# Patient Record
Sex: Female | Born: 1935 | Race: Black or African American | Hispanic: No | Marital: Single | Smoking: Never smoker
Health system: Southern US, Community
[De-identification: ages and names within clinical notes are randomized; demographics above are authoritative.]

## PROBLEM LIST (undated history)

## (undated) DIAGNOSIS — E1129 Type 2 diabetes mellitus with other diabetic kidney complication: Secondary | ICD-10-CM

## (undated) DIAGNOSIS — K219 Gastro-esophageal reflux disease without esophagitis: Secondary | ICD-10-CM

## (undated) DIAGNOSIS — C419 Malignant neoplasm of bone and articular cartilage, unspecified: Secondary | ICD-10-CM

## (undated) DIAGNOSIS — F329 Major depressive disorder, single episode, unspecified: Secondary | ICD-10-CM

## (undated) DIAGNOSIS — M109 Gout, unspecified: Secondary | ICD-10-CM

## (undated) DIAGNOSIS — J449 Chronic obstructive pulmonary disease, unspecified: Secondary | ICD-10-CM

## (undated) DIAGNOSIS — E785 Hyperlipidemia, unspecified: Secondary | ICD-10-CM

## (undated) DIAGNOSIS — I10 Essential (primary) hypertension: Secondary | ICD-10-CM

## (undated) DIAGNOSIS — F32A Depression, unspecified: Secondary | ICD-10-CM

## (undated) HISTORY — PX: ABOVE KNEE LEG AMPUTATION: SUR20

---

## 2017-07-02 ENCOUNTER — Encounter (HOSPITAL_COMMUNITY): Payer: Self-pay | Admitting: Internal Medicine

## 2017-07-02 ENCOUNTER — Inpatient Hospital Stay (HOSPITAL_COMMUNITY)
Admission: EM | Admit: 2017-07-02 | Discharge: 2017-07-05 | DRG: 378 | Disposition: A | Discharge status: Skilled Nursing Facility | Payer: Medicare Other | Attending: Internal Medicine | Admitting: Internal Medicine

## 2017-07-02 DIAGNOSIS — E1122 Type 2 diabetes mellitus with diabetic chronic kidney disease: Secondary | ICD-10-CM | POA: Diagnosis present

## 2017-07-02 DIAGNOSIS — D649 Anemia, unspecified: Secondary | ICD-10-CM | POA: Diagnosis present

## 2017-07-02 DIAGNOSIS — J9610 Chronic respiratory failure, unspecified whether with hypoxia or hypercapnia: Secondary | ICD-10-CM | POA: Diagnosis present

## 2017-07-02 DIAGNOSIS — Z9981 Dependence on supplemental oxygen: Secondary | ICD-10-CM

## 2017-07-02 DIAGNOSIS — E785 Hyperlipidemia, unspecified: Secondary | ICD-10-CM | POA: Diagnosis present

## 2017-07-02 DIAGNOSIS — K922 Gastrointestinal hemorrhage, unspecified: Principal | ICD-10-CM | POA: Diagnosis present

## 2017-07-02 DIAGNOSIS — M1A9XX1 Chronic gout, unspecified, with tophus (tophi): Secondary | ICD-10-CM | POA: Diagnosis not present

## 2017-07-02 DIAGNOSIS — Z85830 Personal history of malignant neoplasm of bone: Secondary | ICD-10-CM

## 2017-07-02 DIAGNOSIS — K219 Gastro-esophageal reflux disease without esophagitis: Secondary | ICD-10-CM | POA: Diagnosis present

## 2017-07-02 DIAGNOSIS — E611 Iron deficiency: Secondary | ICD-10-CM | POA: Diagnosis present

## 2017-07-02 DIAGNOSIS — M109 Gout, unspecified: Secondary | ICD-10-CM | POA: Diagnosis present

## 2017-07-02 DIAGNOSIS — I5032 Chronic diastolic (congestive) heart failure: Secondary | ICD-10-CM | POA: Diagnosis present

## 2017-07-02 DIAGNOSIS — N184 Chronic kidney disease, stage 4 (severe): Secondary | ICD-10-CM

## 2017-07-02 DIAGNOSIS — Z79899 Other long term (current) drug therapy: Secondary | ICD-10-CM

## 2017-07-02 DIAGNOSIS — I10 Essential (primary) hypertension: Secondary | ICD-10-CM | POA: Diagnosis not present

## 2017-07-02 DIAGNOSIS — F329 Major depressive disorder, single episode, unspecified: Secondary | ICD-10-CM | POA: Diagnosis present

## 2017-07-02 DIAGNOSIS — J449 Chronic obstructive pulmonary disease, unspecified: Secondary | ICD-10-CM | POA: Diagnosis present

## 2017-07-02 DIAGNOSIS — Z794 Long term (current) use of insulin: Secondary | ICD-10-CM

## 2017-07-02 DIAGNOSIS — D638 Anemia in other chronic diseases classified elsewhere: Secondary | ICD-10-CM

## 2017-07-02 DIAGNOSIS — N183 Chronic kidney disease, stage 3 (moderate): Secondary | ICD-10-CM | POA: Diagnosis not present

## 2017-07-02 DIAGNOSIS — F32A Depression, unspecified: Secondary | ICD-10-CM | POA: Diagnosis present

## 2017-07-02 DIAGNOSIS — R195 Other fecal abnormalities: Secondary | ICD-10-CM | POA: Diagnosis present

## 2017-07-02 DIAGNOSIS — Z89611 Acquired absence of right leg above knee: Secondary | ICD-10-CM | POA: Diagnosis not present

## 2017-07-02 DIAGNOSIS — Z7951 Long term (current) use of inhaled steroids: Secondary | ICD-10-CM | POA: Diagnosis not present

## 2017-07-02 DIAGNOSIS — I130 Hypertensive heart and chronic kidney disease with heart failure and stage 1 through stage 4 chronic kidney disease, or unspecified chronic kidney disease: Secondary | ICD-10-CM | POA: Diagnosis present

## 2017-07-02 DIAGNOSIS — D631 Anemia in chronic kidney disease: Secondary | ICD-10-CM | POA: Diagnosis present

## 2017-07-02 DIAGNOSIS — E1129 Type 2 diabetes mellitus with other diabetic kidney complication: Secondary | ICD-10-CM | POA: Diagnosis present

## 2017-07-02 HISTORY — DX: Depression, unspecified: F32.A

## 2017-07-02 HISTORY — DX: Type 2 diabetes mellitus with other diabetic kidney complication: E11.29

## 2017-07-02 HISTORY — DX: Gastro-esophageal reflux disease without esophagitis: K21.9

## 2017-07-02 HISTORY — DX: Hyperlipidemia, unspecified: E78.5

## 2017-07-02 HISTORY — DX: Gout, unspecified: M10.9

## 2017-07-02 HISTORY — DX: Malignant neoplasm of bone and articular cartilage, unspecified: C41.9

## 2017-07-02 HISTORY — DX: Essential (primary) hypertension: I10

## 2017-07-02 HISTORY — DX: Major depressive disorder, single episode, unspecified: F32.9

## 2017-07-02 LAB — CBC WITH DIFFERENTIAL/PLATELET
Basophils Absolute: 0 10*3/uL (ref 0.0–0.1)
Basophils Relative: 0 %
EOS PCT: 2 %
Eosinophils Absolute: 0.2 10*3/uL (ref 0.0–0.7)
HCT: 17 % — ABNORMAL LOW (ref 36.0–46.0)
Hemoglobin: 5.2 g/dL — CL (ref 12.0–15.0)
LYMPHS ABS: 1.5 10*3/uL (ref 0.7–4.0)
Lymphocytes Relative: 15 %
MCH: 24.8 pg — AB (ref 26.0–34.0)
MCHC: 30.6 g/dL (ref 30.0–36.0)
MCV: 81 fL (ref 78.0–100.0)
MONOS PCT: 6 %
Monocytes Absolute: 0.6 10*3/uL (ref 0.1–1.0)
NEUTROS ABS: 7.7 10*3/uL (ref 1.7–7.7)
Neutrophils Relative %: 77 %
PLATELETS: 432 10*3/uL — AB (ref 150–400)
RBC: 2.1 MIL/uL — AB (ref 3.87–5.11)
RDW: 19.2 % — ABNORMAL HIGH (ref 11.5–15.5)
WBC: 10 10*3/uL (ref 4.0–10.5)

## 2017-07-02 LAB — COMPREHENSIVE METABOLIC PANEL
ALT: 10 U/L — AB (ref 14–54)
AST: 13 U/L — ABNORMAL LOW (ref 15–41)
Albumin: 3.1 g/dL — ABNORMAL LOW (ref 3.5–5.0)
Alkaline Phosphatase: 95 U/L (ref 38–126)
Anion gap: 11 (ref 5–15)
BUN: 40 mg/dL — AB (ref 6–20)
CALCIUM: 8.4 mg/dL — AB (ref 8.9–10.3)
CHLORIDE: 105 mmol/L (ref 101–111)
CO2: 23 mmol/L (ref 22–32)
CREATININE: 1.61 mg/dL — AB (ref 0.44–1.00)
GFR, EST AFRICAN AMERICAN: 34 mL/min — AB (ref >60)
GFR, EST NON AFRICAN AMERICAN: 29 mL/min — AB (ref >60)
GLUCOSE: 114 mg/dL — AB (ref 65–99)
Potassium: 4.1 mmol/L (ref 3.5–5.1)
Sodium: 139 mmol/L (ref 135–145)
Total Bilirubin: 0.2 mg/dL — ABNORMAL LOW (ref 0.3–1.2)
Total Protein: 6.5 g/dL (ref 6.5–8.1)

## 2017-07-02 LAB — IRON AND TIBC
Iron: 53 ug/dL (ref 28–170)
Saturation Ratios: 18 % (ref 10.4–31.8)
TIBC: 295 ug/dL (ref 250–450)
UIBC: 242 ug/dL

## 2017-07-02 LAB — RETICULOCYTES
RBC.: 2.11 MIL/uL — AB (ref 3.87–5.11)
RETIC COUNT ABSOLUTE: 164.6 10*3/uL (ref 19.0–186.0)
RETIC CT PCT: 7.8 % — AB (ref 0.4–3.1)

## 2017-07-02 LAB — PROTIME-INR
INR: 0.98
PROTHROMBIN TIME: 12.9 s (ref 11.4–15.2)

## 2017-07-02 LAB — FOLATE: FOLATE: 87 ng/mL (ref >5.9)

## 2017-07-02 LAB — FERRITIN: Ferritin: 14 ng/mL (ref 11–307)

## 2017-07-02 LAB — POC OCCULT BLOOD, ED: Fecal Occult Bld: POSITIVE — AB

## 2017-07-02 LAB — VITAMIN B12: VITAMIN B 12: 1902 pg/mL — AB (ref 180–914)

## 2017-07-02 LAB — ABO/RH: ABO/RH(D): O POS

## 2017-07-02 MED ORDER — HYDRALAZINE HCL 25 MG PO TABS
25.0000 mg | ORAL_TABLET | Freq: Four times a day (QID) | ORAL | Status: DC
  Administered 2017-07-02 – 2017-07-05 (×12): 25 mg via ORAL
  Filled 2017-07-02 (×12): qty 1

## 2017-07-02 MED ORDER — DULOXETINE HCL 30 MG PO CPEP
30.0000 mg | ORAL_CAPSULE | Freq: Every day | ORAL | Status: DC
  Administered 2017-07-03 – 2017-07-05 (×3): 30 mg via ORAL
  Filled 2017-07-02 (×3): qty 1

## 2017-07-02 MED ORDER — POLYVINYL ALCOHOL 1.4 % OP SOLN
1.0000 [drp] | Freq: Two times a day (BID) | OPHTHALMIC | Status: DC
  Administered 2017-07-03 – 2017-07-05 (×6): 1 [drp] via OPHTHALMIC
  Filled 2017-07-02: qty 15

## 2017-07-02 MED ORDER — HYDRALAZINE HCL 20 MG/ML IJ SOLN: 5.0000 mg | INTRAMUSCULAR | Status: DC | PRN

## 2017-07-02 MED ORDER — TETRAHYDROZOLINE HCL 0.05 % OP SOLN
1.0000 [drp] | Freq: Two times a day (BID) | OPHTHALMIC | Status: DC
  Administered 2017-07-03 – 2017-07-05 (×6): 1 [drp] via OPHTHALMIC
  Filled 2017-07-02: qty 15

## 2017-07-02 MED ORDER — ONDANSETRON HCL 4 MG/2ML IJ SOLN: 4.0000 mg | Freq: Four times a day (QID) | INTRAMUSCULAR | Status: DC | PRN

## 2017-07-02 MED ORDER — SODIUM CHLORIDE 0.9 % IV SOLN
8.0000 mg/h | INTRAVENOUS | Status: DC
  Administered 2017-07-03: 8 mg/h via INTRAVENOUS
  Filled 2017-07-02 (×2): qty 80

## 2017-07-02 MED ORDER — SODIUM CHLORIDE 0.9 % IV SOLN
80.0000 mg | Freq: Once | INTRAVENOUS | Status: AC
  Administered 2017-07-03: 02:00:00 80 mg via INTRAVENOUS
  Filled 2017-07-02: qty 80

## 2017-07-02 MED ORDER — ACETAMINOPHEN 650 MG RE SUPP: 650.0000 mg | Freq: Four times a day (QID) | RECTAL | Status: DC | PRN

## 2017-07-02 MED ORDER — PANTOPRAZOLE SODIUM 40 MG IV SOLR: 40.0000 mg | Freq: Two times a day (BID) | INTRAVENOUS | Status: DC

## 2017-07-02 MED ORDER — FERROUS SULFATE 325 (65 FE) MG PO TABS
325.0000 mg | ORAL_TABLET | Freq: Two times a day (BID) | ORAL | Status: DC
  Administered 2017-07-02 – 2017-07-05 (×6): 325 mg via ORAL
  Filled 2017-07-02 (×6): qty 1

## 2017-07-02 MED ORDER — ATORVASTATIN CALCIUM 20 MG PO TABS
20.0000 mg | ORAL_TABLET | Freq: Every day | ORAL | Status: DC
  Administered 2017-07-03 – 2017-07-05 (×3): 20 mg via ORAL
  Filled 2017-07-02 (×3): qty 1

## 2017-07-02 MED ORDER — ADULT MULTIVITAMIN W/MINERALS CH
1.0000 | ORAL_TABLET | Freq: Every day | ORAL | Status: DC
  Administered 2017-07-03 – 2017-07-05 (×3): 1 via ORAL
  Filled 2017-07-02 (×3): qty 1

## 2017-07-02 MED ORDER — ZOLPIDEM TARTRATE 5 MG PO TABS: 5.0000 mg | ORAL_TABLET | Freq: Every evening | ORAL | Status: DC | PRN

## 2017-07-02 MED ORDER — SODIUM CHLORIDE 0.9 % IV SOLN
Freq: Once | INTRAVENOUS | Status: AC
  Administered 2017-07-02: 19:00:00 via INTRAVENOUS

## 2017-07-02 MED ORDER — AMLODIPINE BESYLATE 5 MG PO TABS
5.0000 mg | ORAL_TABLET | Freq: Every day | ORAL | Status: DC
  Administered 2017-07-03 – 2017-07-05 (×3): 5 mg via ORAL
  Filled 2017-07-02 (×3): qty 1

## 2017-07-02 MED ORDER — INSULIN ASPART 100 UNIT/ML ~~LOC~~ SOLN: 0.0000 [IU] | Freq: Three times a day (TID) | SUBCUTANEOUS | Status: DC

## 2017-07-02 MED ORDER — MOMETASONE FURO-FORMOTEROL FUM 200-5 MCG/ACT IN AERO
2.0000 | INHALATION_SPRAY | Freq: Two times a day (BID) | RESPIRATORY_TRACT | Status: DC
  Administered 2017-07-03 – 2017-07-05 (×5): 2 via RESPIRATORY_TRACT
  Filled 2017-07-02: qty 8.8

## 2017-07-02 MED ORDER — INSULIN ASPART 100 UNIT/ML ~~LOC~~ SOLN: 0.0000 [IU] | Freq: Every day | SUBCUTANEOUS | Status: DC

## 2017-07-02 MED ORDER — CENTRUM PO CHEW: 1.0000 | CHEWABLE_TABLET | Freq: Every day | ORAL | Status: DC

## 2017-07-02 MED ORDER — ALBUTEROL SULFATE (2.5 MG/3ML) 0.083% IN NEBU: 2.5000 mg | INHALATION_SOLUTION | RESPIRATORY_TRACT | Status: DC | PRN

## 2017-07-02 MED ORDER — VITAMIN C 500 MG PO TABS
500.0000 mg | ORAL_TABLET | Freq: Every day | ORAL | Status: DC
  Administered 2017-07-03 – 2017-07-05 (×3): 500 mg via ORAL
  Filled 2017-07-02 (×3): qty 1

## 2017-07-02 MED ORDER — PROPYLENE GLYCOL 0.6 % OP SOLN: Freq: Two times a day (BID) | OPHTHALMIC | Status: DC

## 2017-07-02 MED ORDER — FOLIC ACID 1 MG PO TABS
1.0000 mg | ORAL_TABLET | Freq: Every day | ORAL | Status: DC
  Administered 2017-07-03 – 2017-07-05 (×3): 1 mg via ORAL
  Filled 2017-07-02 (×3): qty 1

## 2017-07-02 MED ORDER — ACETAMINOPHEN 325 MG PO TABS: 650.0000 mg | ORAL_TABLET | Freq: Four times a day (QID) | ORAL | Status: DC | PRN

## 2017-07-02 MED ORDER — ALLOPURINOL 100 MG PO TABS
100.0000 mg | ORAL_TABLET | Freq: Two times a day (BID) | ORAL | Status: DC
  Administered 2017-07-02 – 2017-07-05 (×6): 100 mg via ORAL
  Filled 2017-07-02 (×6): qty 1

## 2017-07-02 MED ORDER — ONDANSETRON HCL 4 MG PO TABS: 4.0000 mg | ORAL_TABLET | Freq: Four times a day (QID) | ORAL | Status: DC | PRN

## 2017-07-02 MED ORDER — TRAMADOL HCL 50 MG PO TABS: 50.0000 mg | ORAL_TABLET | Freq: Three times a day (TID) | ORAL | Status: DC | PRN

## 2017-07-02 NOTE — ED Triage Notes (Addendum)
Pt arrived to [facility] via [place] from [facility]. Pt has low hemoglobin. Last lab was 4.1. Pt sent to [facility] for further evaluation.

## 2017-07-02 NOTE — ED Provider Notes (Signed)
Patient accepted signout from Dr. Billy Fischer. Recheck on the patient. She is getting blood transfuse. She is alert and nontoxic. No distress. No complaints. Rectal exam performed.Stool is dark blackish green. Patient reports however her stool chronically looks like that with her iron supplementation. We'll proceed with her admission.   Charlesetta Shanks, MD 07/02/17 [phone]
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
## 2017-07-02 NOTE — ED Notes (Signed)
Pt moved from [place] D to R17

## 2017-07-02 NOTE — H&P (Signed)
History and Physical    Eileen Cobb CWC:[id number] DOB: [birth year] DOA: 07/02/2017  Referring MD/NP/PA:   PCP: Eileen Iba, MD   Patient coming from:  The patient is coming from SNF  At baseline, pt is dependent for most of ADL.  Chief Complaint: low hemoglobin  HPI: Eileen Cobb is a 82 y.o. female with medical history significant of bone cancer (s/p of R AKA per pt's report), hypertension, hyperlipidemia, diabetes mellitus, GERD, gout, depression, anemia, who presents with low hemoglobin.   Pt is a poor historian, the history is limited. Pt is currently at [facility]. She has been there since [facility]. She was in [place] at [place]. She was found to have low hemoglobin of 4.1 and sent to [facility] for further evaluation. She denies chest pain, shortness breath, dizziness, lightheadedness. No nausea, vomiting, diarrhea, abdominal pain. She is taking iron pill, so stools are dark normally without change recently. No hematemesis, hematuria, hemoptysis. No falls or trauma. Reports cancer doctor in [place]. Patient dose not have cough, fever, chills, symptoms of UTI or unilateral weakness. Pt seems to have chronic an  ED Course: pt was found to have hemoglobin 5.2 (no baseline hemoglobin available), positive FOBT, INR 0.98, WBC 10.0, creatinine 1.61 (no baseline creatinine available), temperature normal, no tachycardia, oxygen saturation 94% on room air. Patient is admitted to telemetry bed as inpatient. Dr. Janese Banks of GI was consulted.  Review of Systems:   General: no fevers, chills, no body weight gain. Has fatigue. HEENT: no blurry vision, hearing changes or sore throat Respiratory: no dyspnea, coughing, wheezing CV: no chest pain, no palpitations GI: no nausea, vomiting, abdominal pain, diarrhea, constipation. Has dark stool. GU: no dysuria, burning on urination, increased urinary frequency, hematuria  Ext: no leg edema. S/p of R AKA Neuro:  no unilateral weakness, numbness, or tingling, no vision change or hearing loss Skin: no rash, no skin tear. MSK: No muscle spasm, no deformity, no limitation of range of movement in spin Heme: No easy bruising.  Travel history: No recent long distant travel.  Allergy: No Known Allergies  Past Medical History:  Diagnosis Date  . Bone cancer ([place])   . Depression   . Essential hypertension   . GERD (gastroesophageal reflux disease)   . Gout   . HLD (hyperlipidemia)   . Type II diabetes mellitus with renal manifestations [facility])     Past Surgical History:  Procedure Laterality Date  . ABOVE KNEE LEG AMPUTATION Right     Social History:  reports that she has never smoked. She does not have any smokeless tobacco history on file. She reports that she does not drink alcohol or use drugs.  Family History: reviewed with patient, but patient does not know any family medical history.  Prior to Admission medications   Medication Sig Start Date End Date Taking? Authorizing Provider  acetaminophen (TYLENOL) 325 MG tablet Take 650 mg by mouth 2 (two) times daily as needed for mild pain.   Yes [provider]  allopurinol (ZYLOPRIM) 100 MG tablet Take 100 mg by mouth 2 (two) times daily. 06/24/17  Yes [provider]  amLODipine (NORVASC) 5 MG tablet Take 5 mg by mouth daily. 06/09/17  Yes [provider]  atorvastatin (LIPITOR) 20 MG tablet Take 20 mg by mouth daily. 06/18/17  Yes [provider]  DULoxetine (CYMBALTA) 30 MG capsule Take 30 mg by mouth daily. 06/09/17  Yes [provider]  EPOGEN 10000 UNIT/ML injection 20,000 Units  by Subcutaneous Infusion route. Every other week 06/11/17  Yes [provider]  ferrous sulfate 325 (65 FE) MG tablet Take 325 mg by mouth 2 (two) times daily. FOR ANEMIA   Yes [provider]  folic acid (FOLVITE) 1 MG tablet Take 1 mg by mouth daily.   Yes [provider]  furosemide (LASIX) 40 MG  tablet Take 40 mg by mouth daily. 06/18/17  Yes [provider]  hydrALAZINE (APRESOLINE) 25 MG tablet Take 25 mg by mouth every 6 (six) hours. 04/28/17  Yes [provider]  insulin NPH Human (HUMULIN N,NOVOLIN N) 100 UNIT/ML injection Inject 2-8 Units into the skin. SLIDING SCALE BEFORE MEALS-CBG 201-250=2 UNITS, 251-300=4 UNITS, 301-350=6 UNITS, 351-400=8 UNITS, OVER 400 CALL MD, LESS THAN 60=ORANGE JUICE AND RECHECK IN 30 MINUTES   Yes [provider]  multivitamin-iron-minerals-folic acid (CENTRUM) chewable tablet Chew 1 tablet by mouth daily.   Yes [provider]  omeprazole (PRILOSEC) 20 MG capsule Take 20 mg by mouth 2 (two) times daily.   Yes [provider]  Propylene Glycol (SYSTANE BALANCE OP) Place 1 drop into both eyes 2 (two) times daily. FOR DRY EYE SYNDROME   Yes [provider]  SYMBICORT 160-4.5 MCG/ACT inhaler Take 2 puffs by mouth 2 (two) times daily. 06/06/17  Yes [provider]  Tetrahydrozoline HCl (VISINE OP) Place 1 drop into both eyes 2 (two) times daily. FOR DRY EYE SYNDROME   Yes [provider]  traMADol (ULTRAM) 50 MG tablet Take 50 mg by mouth every 8 (eight) hours as needed. 03/31/17  Yes [provider]  vitamin C (ASCORBIC ACID) 500 MG tablet Take 500 mg by mouth daily.   Yes [provider]    Physical Exam: Vitals:   07/02/17 1520 07/02/17 1845 07/02/17 1904 07/02/17 1915  BP: (!) 162/55 (!) 145/65 (!) 145/65 (!) 148/59  Pulse: 87 88 88 86  Resp: 18 18 18 20   Temp: 98.6 F (37 C) 98.2 F (36.8 C) 98.2 F (36.8 C) 98.5 F (36.9 C)  TempSrc: Oral Oral Oral Oral  SpO2: 100% 100%     General: Not in acute distress. Pale looking. HEENT:       Eyes: PERRL, EOMI, no scleral icterus.       ENT: No discharge from the ears and nose, no pharynx injection, no tonsillar enlargement.        Neck: No JVD, no bruit, no mass felt. Heme: No neck lymph node enlargement. Cardiac:  S1/S2, RRR, No murmurs, No gallops or rubs. Respiratory: No rales, wheezing, rhonchi or rubs. GI: Soft, nondistended, nontender, no rebound pain, no organomegaly, BS present. GU: No hematuria Ext: No pitting leg edema bilaterally. 2+DP/PT pulse in left leg. S/p of R AKA.  Musculoskeletal: No joint deformities, No joint redness or warmth, no limitation of ROM in spin. Skin: No rashes.  Neuro: Alert, oriented X3, cranial nerves II-XII grossly intact, moves all extremities normally.  Psych: Patient is not psychotic, no suicidal or hemocidal ideation.  Labs on Admission: I have personally reviewed following labs and imaging studies  CBC:  Recent Labs Lab 07/02/17 1645  WBC 10.0  NEUTROABS 7.7  HGB 5.2*  HCT 17.0*  MCV 81.0  PLT 657*   Basic Metabolic Panel:  Recent Labs Lab 07/02/17 1645  NA 139  K 4.1  CL 105  CO2 23  GLUCOSE 114*  BUN 40*  CREATININE 1.61*  CALCIUM 8.4*   GFR: CrCl cannot be calculated (Unknown  ideal weight.). Liver Function Tests:  Recent Labs Lab 07/02/17 1645  AST 13*  ALT 10*  ALKPHOS 95  BILITOT 0.2*  PROT 6.5  ALBUMIN 3.1*   No results for input(s): LIPASE, AMYLASE in the last 168 hours. No results for input(s): AMMONIA in the last 168 hours. Coagulation Profile:  Recent Labs Lab 07/02/17 1645  INR 0.98   Cardiac Enzymes: No results for input(s): CKTOTAL, CKMB, CKMBINDEX, TROPONINI in the last 168 hours. BNP (last 3 results) No results for input(s): PROBNP in the last 8760 hours. HbA1C: No results for input(s): HGBA1C in the last 72 hours. CBG: No results for input(s): GLUCAP in the last 168 hours. Lipid Profile: No results for input(s): CHOL, HDL, LDLCALC, TRIG, CHOLHDL, LDLDIRECT in the last 72 hours. Thyroid Function Tests: No results for input(s): TSH, T4TOTAL, FREET4, T3FREE, THYROIDAB in the last 72 hours. Anemia Panel:  Recent Labs  07/02/17 1645  RETICCTPCT 7.8*   Urine analysis: No results found for:  COLORURINE, APPEARANCEUR, LABSPEC, PHURINE, GLUCOSEU, HGBUR, BILIRUBINUR, KETONESUR, PROTEINUR, UROBILINOGEN, NITRITE, LEUKOCYTESUR Sepsis Labs: @LABRCNTIP (procalcitonin:4,lacticidven:4) )No results found for this or any previous visit (from the past 240 hour(s)).   Radiological Exams on Admission: No results found.   EKG: Not done in ED, will get one.   Assessment/Plan Principal Problem:   GIB (gastrointestinal bleeding) Active Problems:   Normocytic anemia   Essential hypertension   HLD (hyperlipidemia)   GERD (gastroesophageal reflux disease)   Type II diabetes mellitus with renal manifestations (H[facility])   Gout   Depression   GIB (gastrointestinal bleeding): pt has positive FOBT, with Hgb 5.2. Currently hemodynamically stable. No baseline hemoglobin available.Patient seems to have chronic anemia, since patient is on iron supplement and Epogen injection. Not sure if pt has EGD or colonoscopy in the past. We will request the medical record.  - will admit to tele bed as inpt - transfuse 3 units of blood now - GI was consulted, will follow up recommendations - NPO - Start IV pantoprazole gtt - Zofran IV for nausea - Avoid NSAIDs and SQ heparin - Maintain IV access (2 large bore IVs if possible). - Monitor closely and follow q6h cbc, transfuse as necessary. - LaB: INR, PTT and type screen  Normocytic anemia: Patient seems to have chronic anemia, since patient is on iron supplement and Epogen injection. Now has GIB -check anemia panel  HTN: hemoglobin 163/83.  -continue amlodipine, hydralazine -IV hydralazine when necessary -patient is also on Lasix, not sure if patient has history of CHF are not-->Will check BNP  HLD (hyperlipidemia): -Lipitor  GERD: -on Protonix IV  DM-II: Last A1c not on record. Patient is taking NPH at home -SSI -Check A1c  Gout: -continue home allopurinol   Depression: no suicidal or homicidal ideations. -Continue home medications:  Cymbalta   DVT ppx: SCD Code Status: Full code Family Communication: None at bed side.   Disposition Plan:  Anticipate discharge back to previous home environment Consults called: Dr. Janese Banks of GI was consulted. Admission status:  Inpatient/tele     Date of Service 07/02/2017    Ivor Costa [facility] Pager [phone]  If 7PM-7AM, please contact night-coverage [email or web address] Password [email or web address] 07/02/2017, 8:20 PM

## 2017-07-02 NOTE — Progress Notes (Signed)
The med list sent from [facility] did not include administration records/last doses. Multiple attempts have been made to call the facility at the phone number listed on the internet([phone]) but no one answers.   Romeo Rabon, PharmD. Mobile: [phone]. 07/02/2017,5:22 PM.

## 2017-07-02 NOTE — ED Notes (Signed)
Please call report to Mechele Claude, RN at 2025 at [phone]. Thank you!

## 2017-07-03 ENCOUNTER — Encounter (HOSPITAL_COMMUNITY): Payer: Self-pay | Admitting: *Deleted

## 2017-07-03 LAB — PREPARE RBC (CROSSMATCH)

## 2017-07-03 LAB — HEMOGLOBIN AND HEMATOCRIT, BLOOD
HEMATOCRIT: 31.9 % — AB (ref 36.0–46.0)
Hemoglobin: 10.4 g/dL — ABNORMAL LOW (ref 12.0–15.0)

## 2017-07-03 LAB — CBC
HCT: 29.6 % — ABNORMAL LOW (ref 36.0–46.0)
Hemoglobin: 10 g/dL — ABNORMAL LOW (ref 12.0–15.0)
MCH: 27.8 pg (ref 26.0–34.0)
MCHC: 33.8 g/dL (ref 30.0–36.0)
MCV: 82.2 fL (ref 78.0–100.0)
Platelets: 374 10*3/uL (ref 150–400)
RBC: 3.6 MIL/uL — ABNORMAL LOW (ref 3.87–5.11)
RDW: 15.5 % (ref 11.5–15.5)
WBC: 10.9 10*3/uL — AB (ref 4.0–10.5)

## 2017-07-03 LAB — BASIC METABOLIC PANEL
Anion gap: 8 (ref 5–15)
BUN: 34 mg/dL — ABNORMAL HIGH (ref 6–20)
CHLORIDE: 111 mmol/L (ref 101–111)
CO2: 24 mmol/L (ref 22–32)
Calcium: 8.7 mg/dL — ABNORMAL LOW (ref 8.9–10.3)
Creatinine, Ser: 1.47 mg/dL — ABNORMAL HIGH (ref 0.44–1.00)
GFR calc non Af Amer: 32 mL/min — ABNORMAL LOW (ref >60)
GFR, EST AFRICAN AMERICAN: 38 mL/min — AB (ref >60)
Glucose, Bld: 88 mg/dL (ref 65–99)
POTASSIUM: 4 mmol/L (ref 3.5–5.1)
SODIUM: 143 mmol/L (ref 135–145)

## 2017-07-03 LAB — GLUCOSE, CAPILLARY
GLUCOSE-CAPILLARY: 101 mg/dL — AB (ref 65–99)
GLUCOSE-CAPILLARY: 67 mg/dL (ref 65–99)
GLUCOSE-CAPILLARY: 70 mg/dL (ref 65–99)
Glucose-Capillary: 108 mg/dL — ABNORMAL HIGH (ref 65–99)
Glucose-Capillary: 105 mg/dL — ABNORMAL HIGH (ref 65–99)

## 2017-07-03 LAB — BRAIN NATRIURETIC PEPTIDE: B NATRIURETIC PEPTIDE 5: 153.9 pg/mL — AB (ref 0.0–100.0)

## 2017-07-03 LAB — HEMOGLOBIN A1C
HEMOGLOBIN A1C: 4.8 % (ref 4.8–5.6)
MEAN PLASMA GLUCOSE: 91.06 mg/dL

## 2017-07-03 LAB — MRSA PCR SCREENING: MRSA BY PCR: POSITIVE — AB

## 2017-07-03 LAB — APTT: aPTT: 32 seconds (ref 24–36)

## 2017-07-03 MED ORDER — CHLORHEXIDINE GLUCONATE CLOTH 2 % EX PADS
6.0000 | MEDICATED_PAD | Freq: Every day | CUTANEOUS | Status: DC
  Administered 2017-07-03 – 2017-07-05 (×3): 6 via TOPICAL

## 2017-07-03 MED ORDER — PANTOPRAZOLE SODIUM 40 MG IV SOLR
40.0000 mg | Freq: Two times a day (BID) | INTRAVENOUS | Status: DC
  Administered 2017-07-03 – 2017-07-04 (×3): 40 mg via INTRAVENOUS
  Filled 2017-07-03 (×3): qty 40

## 2017-07-03 MED ORDER — MUPIROCIN 2 % EX OINT
1.0000 "application " | TOPICAL_OINTMENT | Freq: Two times a day (BID) | CUTANEOUS | Status: DC
  Administered 2017-07-03 – 2017-07-05 (×5): 1 via NASAL
  Filled 2017-07-03: qty 22

## 2017-07-03 NOTE — Progress Notes (Signed)
CRITICAL VALUE ALERT  Critical Value:Positive MRSA  Date & Time Notied:  07/03/17;0403  Provider Notified:yes  Orders Received/Actions taken:awaiting any new orders.
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
## 2017-07-03 NOTE — Consult Note (Signed)
Referring Provider: Dr. Waldron Labs Primary Care Physician:  [name], Manya Silvas, MD Primary Gastroenterologist:  Althia Forts  Reason for Consultation:  Anemia  HPI: Eileen Cobb is a 82 y.o. female with multiple medical problems who was transferred from [place] due to [facility]. She was found to have a Hgb 4.1 and is heme positive. Denies visible bleeding but her history is difficult to obtain. She is oriented to person and time. Denies abdominal pain, heartburn, N/V. She has a history of iron deficiency with her being on iron pills. She thinks she had an EGD/colonoscopy several years ago but results are unknown and are not available at this time. Feels ok. Hgb 5.2 on admit yesterday and transfused 2 U PRBCs to 10.  Past Medical History:  Diagnosis Date  . Bone cancer ([place])   . Depression   . Essential hypertension   . GERD (gastroesophageal reflux disease)   . Gout   . HLD (hyperlipidemia)   . Type II diabetes mellitus with renal manifestations [facility])     Past Surgical History:  Procedure Laterality Date  . ABOVE KNEE LEG AMPUTATION Right     Prior to Admission medications   Medication Sig Start Date End Date Taking? Authorizing Provider  acetaminophen (TYLENOL) 325 MG tablet Take 650 mg by mouth 2 (two) times daily as needed for mild pain.   Yes [provider]  allopurinol (ZYLOPRIM) 100 MG tablet Take 100 mg by mouth 2 (two) times daily. 06/24/17  Yes [provider]  amLODipine (NORVASC) 5 MG tablet Take 5 mg by mouth daily. 06/09/17  Yes [provider]  atorvastatin (LIPITOR) 20 MG tablet Take 20 mg by mouth daily. 06/18/17  Yes [provider]  DULoxetine (CYMBALTA) 30 MG capsule Take 30 mg by mouth daily. 06/09/17  Yes [provider]  EPOGEN 10000 UNIT/ML injection 20,000 Units by Subcutaneous Infusion route. Every other week 06/11/17  Yes [provider]  ferrous sulfate 325 (65 FE) MG tablet Take 325 mg by  mouth 2 (two) times daily. FOR ANEMIA   Yes [provider]  folic acid (FOLVITE) 1 MG tablet Take 1 mg by mouth daily.   Yes [provider]  furosemide (LASIX) 40 MG tablet Take 40 mg by mouth daily. 06/18/17  Yes [provider]  hydrALAZINE (APRESOLINE) 25 MG tablet Take 25 mg by mouth every 6 (six) hours. 04/28/17  Yes [provider]  insulin NPH Human (HUMULIN N,NOVOLIN N) 100 UNIT/ML injection Inject 2-8 Units into the skin. SLIDING SCALE BEFORE MEALS-CBG 201-250=2 UNITS, 251-300=4 UNITS, 301-350=6 UNITS, 351-400=8 UNITS, OVER 400 CALL MD, LESS THAN 60=ORANGE JUICE AND RECHECK IN 30 MINUTES   Yes [provider]  multivitamin-iron-minerals-folic acid (CENTRUM) chewable tablet Chew 1 tablet by mouth daily.   Yes [provider]  omeprazole (PRILOSEC) 20 MG capsule Take 20 mg by mouth 2 (two) times daily.   Yes [provider]  Propylene Glycol (SYSTANE BALANCE OP) Place 1 drop into both eyes 2 (two) times daily. FOR DRY EYE SYNDROME   Yes [provider]  SYMBICORT 160-4.5 MCG/ACT inhaler Take 2 puffs by mouth 2 (two) times daily. 06/06/17  Yes [provider]  Tetrahydrozoline HCl (VISINE OP) Place 1 drop into both eyes 2 (two) times daily. FOR DRY EYE SYNDROME   Yes [provider]  traMADol (ULTRAM) 50 MG tablet Take 50 mg by mouth every 8 (eight) hours as needed. 03/31/17  Yes [provider]  vitamin C (ASCORBIC  ACID) 500 MG tablet Take 500 mg by mouth daily.   Yes [provider]    Scheduled Meds: . allopurinol  100 mg Oral BID  . amLODipine  5 mg Oral Daily  . atorvastatin  20 mg Oral Daily  . Chlorhexidine Gluconate Cloth  6 each Topical Q0600  . DULoxetine  30 mg Oral Daily  . ferrous sulfate  325 mg Oral BID  . folic acid  1 mg Oral Daily  . hydrALAZINE  25 mg Oral Q6H  . insulin aspart  0-5 Units Subcutaneous QHS  . insulin aspart  0-9 Units Subcutaneous TID WC  .  mometasone-formoterol  2 puff Inhalation BID  . multivitamin with minerals  1 tablet Oral Daily  . mupirocin ointment  1 application Nasal BID  . [START ON 07/06/2017] pantoprazole  40 mg Intravenous Q12H  . polyvinyl alcohol  1 drop Both Eyes BID  . tetrahydrozoline  1 drop Both Eyes BID  . vitamin C  500 mg Oral Daily   Continuous Infusions: . pantoprozole (PROTONIX) infusion 8 mg/hr (07/03/17 0227)   PRN Meds:.acetaminophen **OR** acetaminophen, albuterol, hydrALAZINE, ondansetron **OR** ondansetron (ZOFRAN) IV, traMADol, zolpidem  Allergies as of 07/02/2017  . (No Known Allergies)    No family history on file.  Social History   Social History  . Marital status: Single    Spouse name: N/A  . Number of children: N/A  . Years of education: N/A   Occupational History  . Not on file.   Social History Main Topics  . Smoking status: Never Smoker  . Smokeless tobacco: Never Used  . Alcohol use No  . Drug use: No  . Sexual activity: Not on file   Other Topics Concern  . Not on file   Social History Narrative  . No narrative on file    Review of Systems: All negative except as stated above in HPI.  Physical Exam: Vital signs: Vitals:   07/03/17 0650 07/03/17 0814  BP: (!) 140/56   Pulse: 84   Resp:    Temp: 97.6 F (36.4 C)   SpO2: 100% 99%     General:   Lethargic, elderly, Well-developed, well-nourished, pleasant and cooperative in NAD Head: normocephalic, atraumatic Eyes: anicteric sclera ENT: oropharynx clear Neck: supple, nontender Lungs:  Clear throughout to auscultation.   No wheezes, crackles, or rhonchi. No acute distress. Heart:  Regular rate and rhythm; no murmurs, clicks, rubs,  or gallops. Abdomen: soft, nontender, nondistended, +BS  Rectal:  Deferred Ext: no LLE edema; s/p right AKA  GI:  Lab Results:  Recent Labs  07/02/17 1645 07/03/17 0828  WBC 10.0 10.9*  HGB 5.2* 10.0*  HCT 17.0* 29.6*  PLT 432* 374   BMET  Recent Labs   07/02/17 1645 07/03/17 0828  NA 139 143  K 4.1 4.0  CL 105 111  CO2 23 24  GLUCOSE 114* 88  BUN 40* 34*  CREATININE 1.61* 1.47*  CALCIUM 8.4* 8.7*   LFT  Recent Labs  07/02/17 1645  PROT 6.5  ALBUMIN 3.1*  AST 13*  ALT 10*  ALKPHOS 95  BILITOT 0.2*   PT/INR  Recent Labs  07/02/17 1645  LABPROT 12.9  INR 0.98     Studies/Results: No results found.  Impression/Plan: Severe anemia with heme positive stool. Has been on iron pills so will have black stools when they occur. Would manage conservatively and NOT pursue any invasive GI procedures unless shows signs of overt bleeding and hemodynamic instability.  Primary team needs to obtain her previous EGD/colonoscopy records from [place] that she reports having. Will d/c Protonix drip and change to IV Q 12 hours. Continue supportive care. Dr. Cristina Gong to f/u tomorrow.    LOS: 1 day   Derby Line C.  07/03/2017, 10:38 AM  Pager [phone]  AFTER 5 pm or on weekends please call [phone]
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
## 2017-07-04 DIAGNOSIS — E785 Hyperlipidemia, unspecified: Secondary | ICD-10-CM

## 2017-07-04 DIAGNOSIS — E1122 Type 2 diabetes mellitus with diabetic chronic kidney disease: Secondary | ICD-10-CM

## 2017-07-04 DIAGNOSIS — K922 Gastrointestinal hemorrhage, unspecified: Principal | ICD-10-CM

## 2017-07-04 DIAGNOSIS — D638 Anemia in other chronic diseases classified elsewhere: Secondary | ICD-10-CM

## 2017-07-04 DIAGNOSIS — M1A9XX1 Chronic gout, unspecified, with tophus (tophi): Secondary | ICD-10-CM

## 2017-07-04 DIAGNOSIS — N183 Chronic kidney disease, stage 3 (moderate): Secondary | ICD-10-CM

## 2017-07-04 DIAGNOSIS — N184 Chronic kidney disease, stage 4 (severe): Secondary | ICD-10-CM

## 2017-07-04 DIAGNOSIS — K219 Gastro-esophageal reflux disease without esophagitis: Secondary | ICD-10-CM

## 2017-07-04 DIAGNOSIS — Z794 Long term (current) use of insulin: Secondary | ICD-10-CM

## 2017-07-04 DIAGNOSIS — I10 Essential (primary) hypertension: Secondary | ICD-10-CM

## 2017-07-04 DIAGNOSIS — F329 Major depressive disorder, single episode, unspecified: Secondary | ICD-10-CM

## 2017-07-04 LAB — BPAM RBC
BLOOD PRODUCT EXPIRATION DATE: 201811142359
BLOOD PRODUCT EXPIRATION DATE: 201811162359
Blood Product Expiration Date: 201811162359
ISSUE DATE / TIME: 201810190020
ISSUE DATE / TIME: 201810181846
ISSUE DATE / TIME: 201810190419
UNIT TYPE AND RH: 5100
Unit Type and Rh: 5100
Unit Type and Rh: 5100

## 2017-07-04 LAB — CBC
HEMATOCRIT: 29.6 % — AB (ref 36.0–46.0)
Hemoglobin: 9.6 g/dL — ABNORMAL LOW (ref 12.0–15.0)
MCH: 26.8 pg (ref 26.0–34.0)
MCHC: 32.4 g/dL (ref 30.0–36.0)
MCV: 82.7 fL (ref 78.0–100.0)
PLATELETS: 432 10*3/uL — AB (ref 150–400)
RBC: 3.58 MIL/uL — ABNORMAL LOW (ref 3.87–5.11)
RDW: 16.2 % — AB (ref 11.5–15.5)
WBC: 10.5 10*3/uL (ref 4.0–10.5)

## 2017-07-04 LAB — GLUCOSE, CAPILLARY
GLUCOSE-CAPILLARY: 85 mg/dL (ref 65–99)
Glucose-Capillary: 92 mg/dL (ref 65–99)
Glucose-Capillary: 104 mg/dL — ABNORMAL HIGH (ref 65–99)
Glucose-Capillary: 116 mg/dL — ABNORMAL HIGH (ref 65–99)

## 2017-07-04 LAB — BASIC METABOLIC PANEL
Anion gap: 8 (ref 5–15)
BUN: 29 mg/dL — ABNORMAL HIGH (ref 6–20)
CALCIUM: 8.7 mg/dL — AB (ref 8.9–10.3)
CO2: 23 mmol/L (ref 22–32)
CREATININE: 1.47 mg/dL — AB (ref 0.44–1.00)
Chloride: 109 mmol/L (ref 101–111)
GFR, EST AFRICAN AMERICAN: 38 mL/min — AB (ref >60)
GFR, EST NON AFRICAN AMERICAN: 32 mL/min — AB (ref >60)
GLUCOSE: 88 mg/dL (ref 65–99)
Potassium: 4.2 mmol/L (ref 3.5–5.1)
Sodium: 140 mmol/L (ref 135–145)

## 2017-07-04 LAB — TYPE AND SCREEN
ABO/RH(D): O POS
Antibody Screen: NEGATIVE
Unit division: 0
Unit division: 0
Unit division: 0

## 2017-07-04 MED ORDER — PANTOPRAZOLE SODIUM 40 MG PO TBEC
40.0000 mg | DELAYED_RELEASE_TABLET | Freq: Every day | ORAL | Status: DC
  Administered 2017-07-05: 40 mg via ORAL
  Filled 2017-07-04: qty 1

## 2017-07-04 NOTE — Progress Notes (Signed)
Patient to sleep; did not awaken.  Hemoglobin stable over past 12 hours.  Nurse's efforts to obtain results of outside studies noted.  Considering this patient's multiple comorbidities, I agree with Dr. Michail Sermon that it is best to hold off on endoscopic or colonoscopic evaluation, unless acute or destabilizing bleeding should occur while she is here. She is probably best managed with supportive care, such as the transfusions she has already received, plus the empiric PPI therapy she is already receiving.  If and when she returns to her home locality, the doctors who know her circumstances better, and are closer to family, could discern whether elective GI workup is warranted.  Okay from my standpoint to advance diet (ordered mechanical soft) and to switch to once daily oral PPI therapy (ordered).   I would probably continue PPI therapy for at least 2 months following discharge,so that it can be seen whether the patient's hemoglobin is holding steady, before it is stopped.  At this time, we will sign off. However, please call us if we can be of further assistance in this patient's care, or you would simply like to discus her case in more detail.  Cleotis Nipper, M.D. Pager [phone] If no answer or after 5 PM call [phone]

## 2017-07-05 DIAGNOSIS — N184 Chronic kidney disease, stage 4 (severe): Secondary | ICD-10-CM

## 2017-07-05 DIAGNOSIS — D638 Anemia in other chronic diseases classified elsewhere: Secondary | ICD-10-CM

## 2017-07-05 LAB — GLUCOSE, CAPILLARY
Glucose-Capillary: 103 mg/dL — ABNORMAL HIGH (ref 65–99)
Glucose-Capillary: 112 mg/dL — ABNORMAL HIGH (ref 65–99)

## 2017-07-05 LAB — CBC
HEMATOCRIT: 29.3 % — AB (ref 36.0–46.0)
HEMOGLOBIN: 9.5 g/dL — AB (ref 12.0–15.0)
MCH: 27.1 pg (ref 26.0–34.0)
MCHC: 32.4 g/dL (ref 30.0–36.0)
MCV: 83.5 fL (ref 78.0–100.0)
Platelets: 429 10*3/uL — ABNORMAL HIGH (ref 150–400)
RBC: 3.51 MIL/uL — AB (ref 3.87–5.11)
RDW: 16.3 % — AB (ref 11.5–15.5)
WBC: 10.5 10*3/uL (ref 4.0–10.5)

## 2017-07-05 MED ORDER — POLYETHYLENE GLYCOL 3350 17 G PO PACK: 17.0000 g | PACK | Freq: Every day | ORAL | Status: AC | PRN

## 2017-07-05 MED ORDER — POLYSACCHARIDE IRON COMPLEX 150 MG PO CAPS: 150.0000 mg | ORAL_CAPSULE | Freq: Two times a day (BID) | ORAL | Status: DC

## 2017-07-05 NOTE — Discharge Summary (Signed)
Physician Discharge Summary  Editha Bridgeforth ENI:[id number] DOB: 1935/06/14 DOA: 07/02/2017  PCP: Earlyne Iba, MD  Admit date: 07/02/2017 Discharge date: 07/05/2017  Time spent: 35 minutes  Recommendations for Outpatient Follow-up:  1. Repeat CBC in 5-7 days to follow Hgb trend  2. Repeat BMET in 5-7 days to follow electrolytes and renal function   Discharge Diagnoses:  Principal Problem:   GIB (gastrointestinal bleeding) Active Problems:   Normocytic anemia   Essential hypertension   HLD (hyperlipidemia)   GERD (gastroesophageal reflux disease)   Type II diabetes mellitus with renal manifestations (HCC)   Gout   Depression   Anemia of chronic disease   Stage 4 chronic kidney disease ([place])   Discharge Condition: stable and improved. Discharge back to SNF for further care and rehab.  Diet recommendation: heart healthy diet   Filed Weights   07/02/17 2233  Weight: 81.6 kg (179 lb 14.3 oz)    History of present illness:  As per H&P written by Dr. Blaine Hamper on 07/02/17 82 y.o. female with medical history significant of bone cancer (s/p of R AKA per pt's report), hypertension, hyperlipidemia, diabetes mellitus, GERD, gout, depression, anemia, who presents with low hemoglobin.  Pt is a poor historian, the history is limited. Pt is currently at [facility]. She has been there since [facility]. She was in [place] at [place]. She was found to have low hemoglobin of 4.1 and sent to [facility] for further evaluation. She denies chest pain, shortness breath, dizziness, lightheadedness. No nausea, vomiting, diarrhea, abdominal pain. She is taking iron pill, so stools are dark normally without change recently. No hematemesis, hematuria, hemoptysis. No falls or trauma. Reports cancer doctor in [place]. Patient dose not have cough, fever, chills, symptoms of UTI or unilateral weakness  Hospital Course:  GIB  - she presented with profound anemia, hemoglobin  of 5.2, she had Hemoccult positive stool. - Unclear baseline, as she recently relocated from [place] area; anticipating chronic component of anemia due to use of epogen and iron as an outpatient. -patient with CKD. -GI input greatly appreciated, at this point no plan for intervention recommended -tolerating diet and PO meds -will discharge on PPI daily and has been advise to Avoid NSAIDs and to use stool softeners   -discharge on niferex BID  Normocytic anemia -Patient seems to have chronic anemia from renal failure, since patient is on iron supplement and Epogen injection. -hemoglobinwas 5.2 on presentation, received 3 units PRBC, Hgb 9.5 at discharge -no signs of acute bleeding -low ferritin suggesting iron deficiency component  -will discharge on niferex BID -if needed might need IV iron infusion down the road  HTN -continue amlodipine, hydralazine and lasix -BP stable  HLD (hyperlipidemia): -will continue Lipitor  GERD: -continue PPI   DM-II: -Patient will resume home hypoglycemic regimen  -last A1c demonstrating excellent control   Gout: -stable and no apparent flare appreciated -continue allopurinol  Depression: -stable mood -no hallucinations and no SI -will continue cymbalta  Chronic diastolic CHF -stable and compensated -continue low sodium diet and daily weight -continue antihypertensive agents and diuretics  Procedures:  None   Consultations:  GI  Discharge Exam: Vitals:   07/05/17 0519 07/05/17 1055  BP: (!) 148/66   Pulse: 93   Resp: 20   Temp: 98.2 F (36.8 C)   SpO2: 100% 100%   GEN: afebrile, no CP, no SOB, no nausea, no vomiting, no abd pain. Patient endorses no active bleeding. Cardiovascular: positive SEM, no rubs, no  gallops, normal S1 and S2, RRR Resp: no wheezing, no crackles, no using accessory muscles Abd: soft, NT, obese, no distended, positive BS Extremities: right AKA, no cyanosis, no clubbing, SCD's appreciated  on LLE.  Discharge Instructions   Discharge Instructions    Diet - low sodium heart healthy    Complete by:  As directed    Discharge instructions    Complete by:  As directed    Maintain adequate hydration Take medications as prescribed Repeat CBC in 5-7 days to follow Hgb trend  Patient will need outpatient follow up with her own gastroenterologist after discharge Increase fiber in her diet and use PRN stool softener to avoid/prevent constipation.     Current Discharge Medication List    START taking these medications   Details  iron polysaccharides (NIFEREX) 150 MG capsule Take 1 capsule (150 mg total) by mouth 2 (two) times daily.    polyethylene glycol (MIRALAX) packet Take 17 g by mouth daily as needed for mild constipation.      CONTINUE these medications which have NOT CHANGED   Details  acetaminophen (TYLENOL) 325 MG tablet Take 650 mg by mouth 2 (two) times daily as needed for mild pain.    allopurinol (ZYLOPRIM) 100 MG tablet Take 100 mg by mouth 2 (two) times daily.    amLODipine (NORVASC) 5 MG tablet Take 5 mg by mouth daily.    atorvastatin (LIPITOR) 20 MG tablet Take 20 mg by mouth daily.    DULoxetine (CYMBALTA) 30 MG capsule Take 30 mg by mouth daily.    EPOGEN 10000 UNIT/ML injection 20,000 Units by Subcutaneous Infusion route. Every other week    folic acid (FOLVITE) 1 MG tablet Take 1 mg by mouth daily.    furosemide (LASIX) 40 MG tablet Take 40 mg by mouth daily.    hydrALAZINE (APRESOLINE) 25 MG tablet Take 25 mg by mouth every 6 (six) hours.    insulin NPH Human (HUMULIN N,NOVOLIN N) 100 UNIT/ML injection Inject 2-8 Units into the skin. SLIDING SCALE BEFORE MEALS-CBG 201-250=2 UNITS, 251-300=4 UNITS, 301-350=6 UNITS, 351-400=8 UNITS, OVER 400 CALL MD, LESS THAN 60=ORANGE JUICE AND RECHECK IN 30 MINUTES    multivitamin-iron-minerals-folic acid (CENTRUM) chewable tablet Chew 1 tablet by mouth daily.    omeprazole (PRILOSEC) 20 MG capsule Take 20  mg by mouth 2 (two) times daily.    Propylene Glycol (SYSTANE BALANCE OP) Place 1 drop into both eyes 2 (two) times daily. FOR DRY EYE SYNDROME    SYMBICORT 160-4.5 MCG/ACT inhaler Take 2 puffs by mouth 2 (two) times daily.    Tetrahydrozoline HCl (VISINE OP) Place 1 drop into both eyes 2 (two) times daily. FOR DRY EYE SYNDROME    vitamin C (ASCORBIC ACID) 500 MG tablet Take 500 mg by mouth daily.      STOP taking these medications     ferrous sulfate 325 (65 FE) MG tablet      traMADol (ULTRAM) 50 MG tablet        No Known Allergies Follow-up Information    Earlyne Iba, MD. Schedule an appointment as soon as possible for a visit in 2 week(s).   Specialty:  Internal Medicine Contact information: [place] [address] [phone]           The results of significant diagnostics from this hospitalization (including imaging, microbiology, ancillary and laboratory) are listed below for reference.    Significant Diagnostic Studies: No results found.  Microbiology: Recent Results (from the  past 240 hour(s))  MRSA PCR Screening     Status: Abnormal   Collection Time: 07/03/17  2:36 AM  Result Value Ref Range Status   MRSA by PCR POSITIVE (A) NEGATIVE Final    Comment:        The GeneXpert MRSA Assay (FDA approved for NASAL specimens only), is one component of a comprehensive MRSA colonization surveillance program. It is not intended to diagnose MRSA infection nor to guide or monitor treatment for MRSA infections. RESULT CALLED TO, READ BACK BY AND VERIFIED WITH: Cathie Hoops [id number] @ 0400 BY J SCOTTON      Labs: Basic Metabolic Panel:  Recent Labs Lab 07/02/17 1645 07/03/17 0828 07/04/17 0536  NA 139 143 140  K 4.1 4.0 4.2  CL 105 111 109  CO2 23 24 23   GLUCOSE 114* 88 88  BUN 40* 34* 29*  CREATININE 1.61* 1.47* 1.47*  CALCIUM 8.4* 8.7* 8.7*   Liver Function Tests:  Recent Labs Lab 07/02/17 1645  AST 13*   ALT 10*  ALKPHOS 95  BILITOT 0.2*  PROT 6.5  ALBUMIN 3.1*   CBC:  Recent Labs Lab 07/02/17 1645 07/03/17 0828 07/03/17 1637 07/04/17 0536 07/05/17 1142  WBC 10.0 10.9*  --  10.5 10.5  NEUTROABS 7.7  --   --   --   --   HGB 5.2* 10.0* 10.4* 9.6* 9.5*  HCT 17.0* 29.6* 31.9* 29.6* 29.3*  MCV 81.0 82.2  --  82.7 83.5  PLT 432* 374  --  432* 429*    BNP (last 3 results)  Recent Labs  07/03/17 0828  BNP 153.9*    CBG:  Recent Labs Lab 07/04/17 1237 07/04/17 1712 07/04/17 2120 07/05/17 0736 07/05/17 1211  GLUCAP 104* 85 116* 103* 112*    Signed:  Barton Dubois MD.  [facility] 07/05/2017, 1:32 PM

## 2017-07-05 NOTE — Clinical Social Work Note (Signed)
[facility] called for patient transport back to [facility].  Dede Query, LCSW [place] Worker - Weekend Coverage cell #: [phone]

## 2017-07-05 NOTE — Clinical Social Work Note (Signed)
SW spoke with Michaele Offer at [facility] to provide discahrge informatin.  [facility] [place]/c summary by 2:30 to be able to accept patient back today due to early closing of pharmacy.  SW made MD aware.  SW will follow up with providing paperwork through the hub.  Dede Query, LCSW [place] Worker - Weekend Coverage cell #: [phone]

## 2017-07-05 NOTE — NC FL2 (Signed)
[place] LEVEL OF CARE SCREENING TOOL     IDENTIFICATION  Patient Name: Eileen Cobb Birthdate: 1935/04/22 Sex: female Admission Date (Current Location): 07/02/2017  [facility] and [place] Number:  Herbalist and Address:  [facility],  [place] [place], [place]      Provider Number: [id number]  Attending Physician Name and Address:  Barton Dubois, MD  Relative Name and Phone Number:       Current Level of Care: Hospital Recommended Level of Care: [place] Prior Approval Number:    Date Approved/Denied:   PASRR Number:    Discharge Plan: SNF    Current Diagnoses: Patient Active Problem List   Diagnosis Date Noted  . Anemia of chronic disease   . Stage 4 chronic kidney disease ([place])   . Normocytic anemia 07/02/2017  . GIB (gastrointestinal bleeding) 07/02/2017  . Essential hypertension   . HLD (hyperlipidemia)   . GERD (gastroesophageal reflux disease)   . Type II diabetes mellitus with renal manifestations ([place])   . Gout   . Depression     Orientation RESPIRATION BLADDER Height & Weight     Self, Time, Situation, Place  Normal   Weight: 179 lb 14.3 oz (81.6 kg) Height:  5\' 2"  (157.5 cm)  BEHAVIORAL SYMPTOMS/MOOD NEUROLOGICAL BOWEL NUTRITION STATUS        Diet (low sodium heart healthy)  AMBULATORY STATUS COMMUNICATION OF NEEDS Skin   Limited Assist Verbally Normal                       Personal Care Assistance Level of Assistance  Bathing, Feeding, Dressing Bathing Assistance: Limited assistance Feeding assistance: Limited assistance Dressing Assistance: Limited assistance     Functional Limitations Info             SPECIAL CARE FACTORS FREQUENCY                       Contractures      Additional Factors Info  Code Status, Allergies Code Status Info: full code Allergies Info: no known allergies           Current Medications (07/05/2017):  This is the current  hospital active medication list Current Facility-Administered Medications  Medication Dose Route Frequency Provider Last Rate Last Dose  . acetaminophen (TYLENOL) tablet 650 mg  650 mg Oral Q6H PRN Ivor Costa, MD       Or  . acetaminophen (TYLENOL) suppository 650 mg  650 mg Rectal Q6H PRN Ivor Costa, MD      . albuterol (PROVENTIL) (2.5 MG/3ML) 0.083% nebulizer solution 2.5 mg  2.5 mg Nebulization Q4H PRN Ivor Costa, MD      . allopurinol (ZYLOPRIM) tablet 100 mg  100 mg Oral BID Ivor Costa, MD   100 mg at 07/05/17 1059  . amLODipine (NORVASC) tablet 5 mg  5 mg Oral Daily Ivor Costa, MD   5 mg at 07/05/17 1059  . atorvastatin (LIPITOR) tablet 20 mg  20 mg Oral Daily Ivor Costa, MD   20 mg at 07/05/17 1059  . Chlorhexidine Gluconate Cloth 2 % PADS 6 each  6 each Topical Q0600 Elgergawy, Silver Huguenin, MD   6 each at 07/05/17 0520  . DULoxetine (CYMBALTA) DR capsule 30 mg  30 mg Oral Daily Ivor Costa, MD   30 mg at 07/05/17 1059  . ferrous sulfate tablet 325 mg  325 mg Oral BID Ivor Costa, MD  325 mg at 07/05/17 1059  . folic acid (FOLVITE) tablet 1 mg  1 mg Oral Daily Ivor Costa, MD   1 mg at 07/05/17 1059  . hydrALAZINE (APRESOLINE) injection 5 mg  5 mg Intravenous Q2H PRN Ivor Costa, MD      . hydrALAZINE (APRESOLINE) tablet 25 mg  25 mg Oral Q6H Ivor Costa, MD   25 mg at 07/05/17 1059  . insulin aspart (novoLOG) injection 0-5 Units  0-5 Units Subcutaneous QHS Ivor Costa, MD      . insulin aspart (novoLOG) injection 0-9 Units  0-9 Units Subcutaneous TID WC Ivor Costa, MD      . mometasone-formoterol [facility]) 200-5 MCG/ACT inhaler 2 puff  2 puff Inhalation BID Ivor Costa, MD   2 puff at 07/05/17 [phone]  . multivitamin with minerals tablet 1 tablet  1 tablet Oral Daily Ivor Costa, MD   1 tablet at 07/05/17 1059  . mupirocin ointment (BACTROBAN) 2 % 1 application  1 application Nasal BID Elgergawy, Silver Huguenin, MD   1 application at 25/63/89 1059  . ondansetron (ZOFRAN) tablet 4 mg  4 mg Oral Q6H PRN Ivor Costa, MD       Or  . ondansetron (ZOFRAN) injection 4 mg  4 mg Intravenous Q6H PRN Ivor Costa, MD      . pantoprazole (PROTONIX) EC tablet 40 mg  40 mg Oral Q0600 Ronald Lobo, MD   40 mg at 07/05/17 0520  . polyvinyl alcohol (LIQUIFILM TEARS) 1.4 % ophthalmic solution 1 drop  1 drop Both Eyes BID Ivor Costa, MD   1 drop at 07/05/17 1059  . tetrahydrozoline 0.05 % ophthalmic solution 1 drop  1 drop Both Eyes BID Ivor Costa, MD   1 drop at 07/05/17 1059  . traMADol (ULTRAM) tablet 50 mg  50 mg Oral Q8H PRN Ivor Costa, MD      . vitamin C (ASCORBIC ACID) tablet 500 mg  500 mg Oral Daily Ivor Costa, MD   500 mg at 07/05/17 1059  . zolpidem (AMBIEN) tablet 5 mg  5 mg Oral QHS PRN Ivor Costa, MD         Discharge Medications: Please see discharge summary for a list of discharge medications.  Relevant Imaging Results:  Relevant Lab Results:   Additional [place], LCSW

## 2017-07-05 NOTE — Care Management Important Message (Signed)
Important Message  Patient Details  Name: Eileen Cobb MRN: [id number] Date of Birth: May 09, 1935   Medicare Important Message Given:  Yes (Contact Precautions, explained notice. Left copy with patient)    Erenest Rasher, RN 07/05/2017, 5:14 PM
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
## 2017-11-29 ENCOUNTER — Inpatient Hospital Stay (HOSPITAL_COMMUNITY)
Admission: EM | Admit: 2017-11-29 | Discharge: 2017-12-07 | DRG: 291 | Disposition: A | Discharge status: Skilled Nursing Facility | Payer: Medicare Other | Attending: Family Medicine | Admitting: Family Medicine

## 2017-11-29 ENCOUNTER — Observation Stay (HOSPITAL_COMMUNITY): Payer: Medicare Other

## 2017-11-29 ENCOUNTER — Encounter (HOSPITAL_COMMUNITY): Payer: Self-pay

## 2017-11-29 ENCOUNTER — Emergency Department (HOSPITAL_COMMUNITY): Payer: Medicare Other

## 2017-11-29 ENCOUNTER — Other Ambulatory Visit: Payer: Self-pay

## 2017-11-29 DIAGNOSIS — N184 Chronic kidney disease, stage 4 (severe): Secondary | ICD-10-CM | POA: Diagnosis present

## 2017-11-29 DIAGNOSIS — E1121 Type 2 diabetes mellitus with diabetic nephropathy: Secondary | ICD-10-CM | POA: Diagnosis not present

## 2017-11-29 DIAGNOSIS — R0902 Hypoxemia: Secondary | ICD-10-CM

## 2017-11-29 DIAGNOSIS — D631 Anemia in chronic kidney disease: Secondary | ICD-10-CM | POA: Diagnosis present

## 2017-11-29 DIAGNOSIS — R32 Unspecified urinary incontinence: Secondary | ICD-10-CM | POA: Diagnosis present

## 2017-11-29 DIAGNOSIS — J9811 Atelectasis: Secondary | ICD-10-CM | POA: Diagnosis present

## 2017-11-29 DIAGNOSIS — F32A Depression, unspecified: Secondary | ICD-10-CM | POA: Diagnosis present

## 2017-11-29 DIAGNOSIS — G8929 Other chronic pain: Secondary | ICD-10-CM | POA: Diagnosis present

## 2017-11-29 DIAGNOSIS — I10 Essential (primary) hypertension: Secondary | ICD-10-CM | POA: Diagnosis not present

## 2017-11-29 DIAGNOSIS — E8770 Fluid overload, unspecified: Secondary | ICD-10-CM | POA: Diagnosis present

## 2017-11-29 DIAGNOSIS — D62 Acute posthemorrhagic anemia: Secondary | ICD-10-CM | POA: Diagnosis present

## 2017-11-29 DIAGNOSIS — J189 Pneumonia, unspecified organism: Secondary | ICD-10-CM | POA: Diagnosis present

## 2017-11-29 DIAGNOSIS — E1122 Type 2 diabetes mellitus with diabetic chronic kidney disease: Secondary | ICD-10-CM | POA: Diagnosis present

## 2017-11-29 DIAGNOSIS — M109 Gout, unspecified: Secondary | ICD-10-CM | POA: Diagnosis present

## 2017-11-29 DIAGNOSIS — F329 Major depressive disorder, single episode, unspecified: Secondary | ICD-10-CM | POA: Diagnosis present

## 2017-11-29 DIAGNOSIS — K219 Gastro-esophageal reflux disease without esophagitis: Secondary | ICD-10-CM | POA: Diagnosis present

## 2017-11-29 DIAGNOSIS — N179 Acute kidney failure, unspecified: Secondary | ICD-10-CM | POA: Diagnosis not present

## 2017-11-29 DIAGNOSIS — J9621 Acute and chronic respiratory failure with hypoxia: Secondary | ICD-10-CM | POA: Diagnosis present

## 2017-11-29 DIAGNOSIS — E1129 Type 2 diabetes mellitus with other diabetic kidney complication: Secondary | ICD-10-CM | POA: Diagnosis present

## 2017-11-29 DIAGNOSIS — I130 Hypertensive heart and chronic kidney disease with heart failure and stage 1 through stage 4 chronic kidney disease, or unspecified chronic kidney disease: Secondary | ICD-10-CM | POA: Diagnosis not present

## 2017-11-29 DIAGNOSIS — I5033 Acute on chronic diastolic (congestive) heart failure: Secondary | ICD-10-CM

## 2017-11-29 DIAGNOSIS — Z794 Long term (current) use of insulin: Secondary | ICD-10-CM

## 2017-11-29 DIAGNOSIS — K922 Gastrointestinal hemorrhage, unspecified: Secondary | ICD-10-CM | POA: Diagnosis present

## 2017-11-29 DIAGNOSIS — Z9981 Dependence on supplemental oxygen: Secondary | ICD-10-CM

## 2017-11-29 DIAGNOSIS — Z79899 Other long term (current) drug therapy: Secondary | ICD-10-CM

## 2017-11-29 DIAGNOSIS — R0602 Shortness of breath: Secondary | ICD-10-CM

## 2017-11-29 DIAGNOSIS — D500 Iron deficiency anemia secondary to blood loss (chronic): Secondary | ICD-10-CM

## 2017-11-29 DIAGNOSIS — Z85830 Personal history of malignant neoplasm of bone: Secondary | ICD-10-CM

## 2017-11-29 DIAGNOSIS — E785 Hyperlipidemia, unspecified: Secondary | ICD-10-CM | POA: Diagnosis present

## 2017-11-29 DIAGNOSIS — Z7951 Long term (current) use of inhaled steroids: Secondary | ICD-10-CM

## 2017-11-29 DIAGNOSIS — J440 Chronic obstructive pulmonary disease with acute lower respiratory infection: Secondary | ICD-10-CM | POA: Diagnosis present

## 2017-11-29 DIAGNOSIS — Z8249 Family history of ischemic heart disease and other diseases of the circulatory system: Secondary | ICD-10-CM

## 2017-11-29 DIAGNOSIS — D649 Anemia, unspecified: Secondary | ICD-10-CM

## 2017-11-29 DIAGNOSIS — Z89611 Acquired absence of right leg above knee: Secondary | ICD-10-CM

## 2017-11-29 DIAGNOSIS — Z66 Do not resuscitate: Secondary | ICD-10-CM | POA: Diagnosis present

## 2017-11-29 LAB — RETICULOCYTES
RBC.: 2.84 MIL/uL — AB (ref 3.87–5.11)
RETIC COUNT ABSOLUTE: 133.5 10*3/uL (ref 19.0–186.0)
Retic Ct Pct: 4.7 % — ABNORMAL HIGH (ref 0.4–3.1)

## 2017-11-29 LAB — I-STAT TROPONIN, ED: Troponin i, poc: 0.01 ng/mL (ref 0.00–0.08)

## 2017-11-29 LAB — COMPREHENSIVE METABOLIC PANEL
ALT: 12 U/L — ABNORMAL LOW (ref 14–54)
ANION GAP: 11 (ref 5–15)
AST: 15 U/L (ref 15–41)
Albumin: 3.3 g/dL — ABNORMAL LOW (ref 3.5–5.0)
Alkaline Phosphatase: 136 U/L — ABNORMAL HIGH (ref 38–126)
BILIRUBIN TOTAL: 0.3 mg/dL (ref 0.3–1.2)
BUN: 55 mg/dL — AB (ref 6–20)
CHLORIDE: 107 mmol/L (ref 101–111)
CO2: 22 mmol/L (ref 22–32)
Calcium: 8.9 mg/dL (ref 8.9–10.3)
Creatinine, Ser: 2.25 mg/dL — ABNORMAL HIGH (ref 0.44–1.00)
GFR calc Af Amer: 22 mL/min — ABNORMAL LOW (ref >60)
GFR calc non Af Amer: 19 mL/min — ABNORMAL LOW (ref >60)
Glucose, Bld: 94 mg/dL (ref 65–99)
POTASSIUM: 4.6 mmol/L (ref 3.5–5.1)
Sodium: 140 mmol/L (ref 135–145)
TOTAL PROTEIN: 6.6 g/dL (ref 6.5–8.1)

## 2017-11-29 LAB — CBC WITH DIFFERENTIAL/PLATELET
BASOS PCT: 0 %
Basophils Absolute: 0 10*3/uL (ref 0.0–0.1)
EOS PCT: 1 %
Eosinophils Absolute: 0.1 10*3/uL (ref 0.0–0.7)
HEMATOCRIT: 21.7 % — AB (ref 36.0–46.0)
Hemoglobin: 6.5 g/dL — CL (ref 12.0–15.0)
LYMPHS PCT: 10 %
Lymphs Abs: 1 10*3/uL (ref 0.7–4.0)
MCH: 22.9 pg — ABNORMAL LOW (ref 26.0–34.0)
MCHC: 30 g/dL (ref 30.0–36.0)
MCV: 76.4 fL — AB (ref 78.0–100.0)
MONO ABS: 0.7 10*3/uL (ref 0.1–1.0)
MONOS PCT: 6 %
Neutro Abs: 9.1 10*3/uL — ABNORMAL HIGH (ref 1.7–7.7)
Neutrophils Relative %: 83 %
PLATELETS: 514 10*3/uL — AB (ref 150–400)
RBC: 2.84 MIL/uL — ABNORMAL LOW (ref 3.87–5.11)
RDW: 19.7 % — AB (ref 11.5–15.5)
WBC: 10.9 10*3/uL — ABNORMAL HIGH (ref 4.0–10.5)

## 2017-11-29 LAB — CBG MONITORING, ED: Glucose-Capillary: 88 mg/dL (ref 65–99)

## 2017-11-29 LAB — POC OCCULT BLOOD, ED: FECAL OCCULT BLD: POSITIVE — AB

## 2017-11-29 LAB — PREPARE RBC (CROSSMATCH)

## 2017-11-29 LAB — GLUCOSE, CAPILLARY: GLUCOSE-CAPILLARY: 90 mg/dL (ref 65–99)

## 2017-11-29 MED ORDER — ONDANSETRON HCL 4 MG PO TABS: 4.0000 mg | ORAL_TABLET | Freq: Four times a day (QID) | ORAL | Status: DC | PRN

## 2017-11-29 MED ORDER — ALBUTEROL SULFATE (2.5 MG/3ML) 0.083% IN NEBU
2.5000 mg | INHALATION_SOLUTION | RESPIRATORY_TRACT | Status: DC | PRN
  Administered 2017-11-30: 2.5 mg via RESPIRATORY_TRACT
  Filled 2017-11-29: qty 3

## 2017-11-29 MED ORDER — PIPERACILLIN-TAZOBACTAM 3.375 G IVPB 30 MIN
3.3750 g | Freq: Once | INTRAVENOUS | Status: AC
  Administered 2017-11-30: 3.375 g via INTRAVENOUS
  Filled 2017-11-29: qty 50

## 2017-11-29 MED ORDER — LUBRIDERM SERIOUSLY SENSITIVE EX LOTN
1.0000 "application " | TOPICAL_LOTION | Freq: Two times a day (BID) | CUTANEOUS | Status: DC
  Administered 2017-12-01 – 2017-12-07 (×12): 1 via TOPICAL
  Filled 2017-11-29: qty 473

## 2017-11-29 MED ORDER — ATORVASTATIN CALCIUM 20 MG PO TABS
20.0000 mg | ORAL_TABLET | Freq: Every day | ORAL | Status: DC
  Administered 2017-12-01 – 2017-12-06 (×6): 20 mg via ORAL
  Filled 2017-11-29 (×5): qty 1
  Filled 2017-11-29: qty 2

## 2017-11-29 MED ORDER — ACETAMINOPHEN 325 MG PO TABS
650.0000 mg | ORAL_TABLET | Freq: Four times a day (QID) | ORAL | Status: DC | PRN
  Administered 2017-12-06: 650 mg via ORAL
  Filled 2017-11-29: qty 2

## 2017-11-29 MED ORDER — SODIUM CHLORIDE 0.9 % IV SOLN
8.0000 mg/h | INTRAVENOUS | Status: DC
  Filled 2017-11-29 (×2): qty 80

## 2017-11-29 MED ORDER — MOMETASONE FURO-FORMOTEROL FUM 200-5 MCG/ACT IN AERO
2.0000 | INHALATION_SPRAY | Freq: Two times a day (BID) | RESPIRATORY_TRACT | Status: DC
  Administered 2017-12-01 – 2017-12-07 (×12): 2 via RESPIRATORY_TRACT
  Filled 2017-11-29: qty 8.8

## 2017-11-29 MED ORDER — FERROUS SULFATE 325 (65 FE) MG PO TABS
325.0000 mg | ORAL_TABLET | Freq: Two times a day (BID) | ORAL | Status: DC
  Administered 2017-11-30 – 2017-12-07 (×15): 325 mg via ORAL
  Filled 2017-11-29 (×17): qty 1

## 2017-11-29 MED ORDER — FUROSEMIDE 10 MG/ML IJ SOLN
40.0000 mg | Freq: Once | INTRAMUSCULAR | Status: AC
  Administered 2017-11-30: 40 mg via INTRAVENOUS
  Filled 2017-11-29: qty 4

## 2017-11-29 MED ORDER — ACETAMINOPHEN 650 MG RE SUPP: 650.0000 mg | Freq: Four times a day (QID) | RECTAL | Status: DC | PRN

## 2017-11-29 MED ORDER — SODIUM CHLORIDE 0.9 % IV SOLN
10.0000 mL/h | Freq: Once | INTRAVENOUS | Status: AC
  Administered 2017-11-29: 10 mL/h via INTRAVENOUS

## 2017-11-29 MED ORDER — VANCOMYCIN HCL IN DEXTROSE 1-5 GM/200ML-% IV SOLN
1000.0000 mg | Freq: Once | INTRAVENOUS | Status: AC
  Administered 2017-11-30: 1000 mg via INTRAVENOUS
  Filled 2017-11-29: qty 200

## 2017-11-29 MED ORDER — FUROSEMIDE 40 MG PO TABS: 40.0000 mg | ORAL_TABLET | Freq: Every day | ORAL | Status: DC

## 2017-11-29 MED ORDER — HYDRALAZINE HCL 25 MG PO TABS
25.0000 mg | ORAL_TABLET | Freq: Four times a day (QID) | ORAL | Status: DC
  Administered 2017-11-30 – 2017-12-07 (×26): 25 mg via ORAL
  Filled 2017-11-29 (×29): qty 1

## 2017-11-29 MED ORDER — FOLIC ACID 1 MG PO TABS
1.0000 mg | ORAL_TABLET | Freq: Every day | ORAL | Status: DC
  Administered 2017-12-01 – 2017-12-07 (×7): 1 mg via ORAL
  Filled 2017-11-29 (×7): qty 1

## 2017-11-29 MED ORDER — AMLODIPINE BESYLATE 5 MG PO TABS
5.0000 mg | ORAL_TABLET | Freq: Every day | ORAL | Status: DC
  Administered 2017-12-01 – 2017-12-07 (×6): 5 mg via ORAL
  Filled 2017-11-29 (×7): qty 1

## 2017-11-29 MED ORDER — SODIUM CHLORIDE 0.9 % IV BOLUS (SEPSIS): 1000.0000 mL | Freq: Once | INTRAVENOUS | Status: DC

## 2017-11-29 MED ORDER — VITAMIN C 500 MG PO TABS
500.0000 mg | ORAL_TABLET | Freq: Every day | ORAL | Status: DC
  Administered 2017-12-01 – 2017-12-07 (×7): 500 mg via ORAL
  Filled 2017-11-29 (×7): qty 1

## 2017-11-29 MED ORDER — ONDANSETRON HCL 4 MG/2ML IJ SOLN: 4.0000 mg | Freq: Four times a day (QID) | INTRAMUSCULAR | Status: DC | PRN

## 2017-11-29 MED ORDER — INSULIN ASPART 100 UNIT/ML ~~LOC~~ SOLN
0.0000 [IU] | Freq: Three times a day (TID) | SUBCUTANEOUS | Status: DC
  Administered 2017-11-30: 2 [IU] via SUBCUTANEOUS
  Administered 2017-12-03 – 2017-12-06 (×2): 1 [IU] via SUBCUTANEOUS

## 2017-11-29 MED ORDER — DULOXETINE HCL 30 MG PO CPEP
30.0000 mg | ORAL_CAPSULE | Freq: Every day | ORAL | Status: DC
  Administered 2017-12-01 – 2017-12-07 (×7): 30 mg via ORAL
  Filled 2017-11-29 (×7): qty 1

## 2017-11-29 MED ORDER — ALLOPURINOL 100 MG PO TABS
100.0000 mg | ORAL_TABLET | Freq: Two times a day (BID) | ORAL | Status: DC
  Administered 2017-11-30 – 2017-12-07 (×15): 100 mg via ORAL
  Filled 2017-11-29 (×15): qty 1

## 2017-11-29 MED ORDER — ADULT MULTIVITAMIN W/MINERALS CH
1.0000 | ORAL_TABLET | Freq: Every day | ORAL | Status: DC
  Administered 2017-12-01 – 2017-12-07 (×7): 1 via ORAL
  Filled 2017-11-29 (×7): qty 1

## 2017-11-29 MED ORDER — SODIUM CHLORIDE 0.9 % IV SOLN
80.0000 mg | Freq: Once | INTRAVENOUS | Status: AC
  Administered 2017-11-29: 21:00:00 80 mg via INTRAVENOUS
  Filled 2017-11-29: qty 80

## 2017-11-29 NOTE — ED Provider Notes (Signed)
[employer] DEPT Provider Note   CSN: [id number] Arrival date & time: 11/29/17  [place]     History   Chief Complaint No chief complaint on file.   HPI Eileen Cobb is a 83 y.o. female hx of HL, DM, HTN,  Anemia, here with anemia. Patient is currently at rehab facility. She has been getting more short of breath for the last several weeks and required 4 L [place] from 2 L [place]. She has been getting labs at the facility and her Hg trended down from 7.1 on 3/1 to down to 5.9 on 3/15. Patient was admitted in Oct 2018 for anemia and required several units of transfusion. Patient was seen by Dr. Michail Sermon from GI who didn't recommend colonoscopy or endoscopy due to multi co morbidities. Patient was discharged on PPI but is not currently on PPI. Patient also not on ASA or blood thinners or NSAIDs.   The history is provided by the patient.    Past Medical History:  Diagnosis Date  . Bone cancer ([place])   . Depression   . Essential hypertension   . GERD (gastroesophageal reflux disease)   . Gout   . HLD (hyperlipidemia)   . Type II diabetes mellitus with renal manifestations [facility])     Patient Active Problem List   Diagnosis Date Noted  . Anemia of chronic disease   . Stage 4 chronic kidney disease ([place])   . Normocytic anemia 07/02/2017  . GIB (gastrointestinal bleeding) 07/02/2017  . Essential hypertension   . HLD (hyperlipidemia)   . GERD (gastroesophageal reflux disease)   . Type II diabetes mellitus with renal manifestations ([facility])   . Gout   . Depression     Past Surgical History:  Procedure Laterality Date  . ABOVE KNEE LEG AMPUTATION Right     OB History    No data available       Home Medications    Prior to Admission medications   Medication Sig Start Date End Date Taking? Authorizing Provider  acetaminophen (TYLENOL) 325 MG tablet Take 650 mg by mouth 2 (two) times daily as needed for mild pain.    [provider]  allopurinol  (ZYLOPRIM) 100 MG tablet Take 100 mg by mouth 2 (two) times daily. 06/24/17   [provider]  amLODipine (NORVASC) 5 MG tablet Take 5 mg by mouth daily. 06/09/17   [provider]  atorvastatin (LIPITOR) 20 MG tablet Take 20 mg by mouth daily. 06/18/17   [provider]  DULoxetine (CYMBALTA) 30 MG capsule Take 30 mg by mouth daily. 06/09/17   [provider]  EPOGEN 10000 UNIT/ML injection 20,000 Units by Subcutaneous Infusion route. Every other week 06/11/17   [provider]  folic acid (FOLVITE) 1 MG tablet Take 1 mg by mouth daily.    [provider]  furosemide (LASIX) 40 MG tablet Take 40 mg by mouth daily. 06/18/17   [provider]  hydrALAZINE (APRESOLINE) 25 MG tablet Take 25 mg by mouth every 6 (six) hours. 04/28/17   [provider]  insulin NPH Human (HUMULIN N,NOVOLIN N) 100 UNIT/ML injection Inject 2-8 Units into the skin. SLIDING SCALE BEFORE MEALS-CBG 201-250=2 UNITS, 251-300=4 UNITS, 301-350=6 UNITS, 351-400=8 UNITS, OVER 400 CALL MD, LESS THAN 60=ORANGE JUICE AND RECHECK IN 30 MINUTES    [provider]  iron polysaccharides (NIFEREX) 150 MG capsule Take 1 capsule (150 mg total) by mouth 2 (two) times daily. 07/05/17   Barton Dubois, MD  multivitamin-iron-minerals-folic acid (CENTRUM) chewable tablet Chew 1 tablet by mouth daily.    [provider]  omeprazole (PRILOSEC) 20 MG capsule Take 20 mg by mouth 2 (two) times daily.    [provider]  polyethylene glycol (MIRALAX) packet Take 17 g by mouth daily as needed for mild constipation. 07/05/17   Barton Dubois, MD  Propylene Glycol (SYSTANE BALANCE OP) Place 1 drop into both eyes 2 (two) times daily. FOR DRY EYE SYNDROME    [provider]  SYMBICORT 160-4.5 MCG/ACT inhaler Take 2 puffs by mouth 2 (two) times daily. 06/06/17   [provider]  Tetrahydrozoline HCl (VISINE OP) Place 1 drop into both eyes 2 (two) times  daily. FOR DRY EYE SYNDROME    [provider]  vitamin C (ASCORBIC ACID) 500 MG tablet Take 500 mg by mouth daily.    [provider]    Family History History reviewed. No pertinent family history.  Social History Social History   Tobacco Use  . Smoking status: Never Smoker  . Smokeless tobacco: Never Used  Substance Use Topics  . Alcohol use: No  . Drug use: No     Allergies   Patient has no known allergies.   Review of Systems Review of Systems  Respiratory: Positive for shortness of breath.   Neurological: Positive for weakness.  All other systems reviewed and are negative.    Physical Exam Updated Vital Signs BP (!) 159/67 (BP Location: Left Wrist)   Pulse 96   Temp 99 F (37.2 C) (Oral)   Resp 18   SpO2 95%   Physical Exam  Constitutional: She is oriented to person, place, and time.  Chronically ill   HENT:  Head: Normocephalic.  Eyes: Pupils are equal, round, and reactive to light.  Conjunctiva pale   Neck: Normal range of motion. Neck supple.  Cardiovascular: Normal rate, regular rhythm and normal heart sounds.  Pulmonary/Chest: Effort normal.  Diminished bilaterally   Abdominal: Soft. Bowel sounds are normal. She exhibits no distension. There is no tenderness. There is no guarding.  Musculoskeletal: Normal range of motion.  Neurological: She is alert and oriented to person, place, and time.  Skin: Skin is warm.  Nursing note and vitals reviewed.    ED Treatments / Results  Labs (all labs ordered are listed, but only abnormal results are displayed) Labs Reviewed  POC OCCULT BLOOD, ED - Abnormal; Notable for the following components:      Result Value   Fecal Occult Bld POSITIVE (*)    All other components within normal limits  CBC WITH DIFFERENTIAL/PLATELET  COMPREHENSIVE METABOLIC PANEL  VITAMIN T46  FOLATE  IRON AND TIBC  FERRITIN  RETICULOCYTES  TYPE AND SCREEN  PREPARE RBC (CROSSMATCH)    EKG  EKG  Interpretation None       Radiology No results found.  Procedures Procedures (including critical care time)  CRITICAL CARE Performed by: Wandra Arthurs   Total critical care time: 30 minutes  Critical care time was exclusive of separately billable procedures and treating other patients.  Critical care was necessary to treat or prevent imminent or life-threatening deterioration.  Critical care was time spent personally by me on the following activities: development of treatment plan with patient and/or surrogate as well as nursing, discussions with consultants, evaluation of patient's response to treatment, examination of patient, obtaining history from patient or surrogate, ordering and performing treatments and interventions, ordering and review of laboratory studies, ordering and review of radiographic  studies, pulse oximetry and re-evaluation of patient's condition.   Medications Ordered in ED Medications  0.9 %  sodium chloride infusion (not administered)  pantoprazole (PROTONIX) 80 mg in sodium chloride 0.9 % 100 mL IVPB (not administered)  pantoprazole (PROTONIX) 80 mg in sodium chloride 0.9 % 250 mL (0.32 mg/mL) infusion (not administered)     Initial Impression / Assessment and Plan / ED Course  I have reviewed the triage vital signs and the nursing notes.  Pertinent labs & imaging results that were available during my care of the patient were reviewed by me and considered in my medical decision making (see chart for details).     Eileen Cobb is a 83 y.o. female here with anemia. Patient was thought to have anemia from multifactorial causes- slow GI bleed, anemia of chronic disease, CKD. She is on procrit and iron supplement but not on PPI. She has persistent melena. Will get labs, anemia panel. Will likely need GI consult and transfusion.   9:09 PM Hg 6.5. I called Dr. Carlean Purl who will see as consult. Likely medical management. Ordered 2 U PRBC, protonix drip.  Hospitalist to admit.   Final Clinical Impressions(s) / ED Diagnoses   Final diagnoses:  None    ED Discharge Orders    None       Drenda Freeze, MD 11/29/17 2122

## 2017-11-29 NOTE — ED Notes (Signed)
ED TO INPATIENT HANDOFF REPORT  Name/Age/Gender Eileen Cobb 83 y.o. female  Code Status Code Status History    Date Active Date Inactive Code Status Order ID Comments User Context   07/02/2017 19:55 07/05/2017 21:09 Full Code [id number]  Ivor Costa, MD ED      Home/SNF/Other Skilled nursing facility  Chief Complaint Abnormal Lab  Level of Care/Admitting Diagnosis ED Disposition    ED Disposition Condition [place]: [facility] [[id number]]  Level of Care: Telemetry [5]  Admit to tele based on following criteria: Monitor for Ischemic changes  Diagnosis: Acute GI bleeding [191660]  Admitting Physician: [name] [phone]  Attending Physician: [name] [phone]  PT Class (Do Not Modify): Observation [104]  PT Acc Code (Do Not Modify): Observation [10022]       Medical History Past Medical History:  Diagnosis Date  . Bone cancer ([place])   . Depression   . Essential hypertension   . GERD (gastroesophageal reflux disease)   . Gout   . HLD (hyperlipidemia)   . Type II diabetes mellitus with renal manifestations ([facility])     Allergies No Known Allergies  IV Location/Drains/Wounds Patient Lines/Drains/Airways Status   Active Line/Drains/Airways    Name:   Placement date:   Placement time:   Site:   Days:   Peripheral IV 11/29/17 Right Forearm   11/29/17    1930    Forearm   less than 1   External Urinary Catheter   07/03/17    0008    --   149          Labs/Imaging Results for orders placed or performed during the hospital encounter of 11/29/17 (from the past 48 hour(s))  Type and screen     Status: None (Preliminary result)   Collection Time: 11/29/17  6:00 PM  Result Value Ref Range   ABO/RH(D) O POS    Antibody Screen NEG    Sample Expiration 12/02/2017    Unit Number [id number]    Blood Component Type RBC LR PHER1    Unit division 00    Status of Unit ALLOCATED    Transfusion Status OK TO  TRANSFUSE    Crossmatch Result Compatible    Unit Number [id number]    Blood Component Type RED CELLS,LR    Unit division 00    Status of Unit ISSUED    Transfusion Status OK TO TRANSFUSE    Crossmatch Result      Compatible Performed at [facility], [place] [address], [place], [place] [address]   POC occult blood, ED     Status: Abnormal   Collection Time: 11/29/17  6:37 PM  Result Value Ref Range   Fecal Occult Bld POSITIVE (A) NEGATIVE  Prepare RBC     Status: None   Collection Time: 11/29/17  7:00 PM  Result Value Ref Range   Order Confirmation      ORDER PROCESSED BY BLOOD BANK Performed at [facility], [place] [address] [place], [place] [address]   CBC with Differential/Platelet     Status: Abnormal   Collection Time: 11/29/17  7:13 PM  Result Value Ref Range   WBC 10.9 (H) 4.0 - 10.5 K/uL   RBC 2.84 (L) 3.87 - 5.11 MIL/uL   Hemoglobin 6.5 (LL) 12.0 - 15.0 g/dL    Comment: REPEATED TO VERIFY CRITICAL RESULT CALLED TO, READ BACK BY AND VERIFIED WITH: K RAME,RN @2004  11/29/17 MKELLY  HCT 21.7 (L) 36.0 - 46.0 %   MCV 76.4 (L) 78.0 - 100.0 fL   MCH 22.9 (L) 26.0 - 34.0 pg   MCHC 30.0 30.0 - 36.0 g/dL   RDW 19.7 (H) 11.5 - 15.5 %   Platelets 514 (H) 150 - 400 K/uL   Neutrophils Relative % 83 %   Neutro Abs 9.1 (H) 1.7 - 7.7 K/uL   Lymphocytes Relative 10 %   Lymphs Abs 1.0 0.7 - 4.0 K/uL   Monocytes Relative 6 %   Monocytes Absolute 0.7 0.1 - 1.0 K/uL   Eosinophils Relative 1 %   Eosinophils Absolute 0.1 0.0 - 0.7 K/uL   Basophils Relative 0 %   Basophils Absolute 0.0 0.0 - 0.1 K/uL    Comment: Performed at [facility], [place] [address], [place], [place] [address]  Comprehensive metabolic panel     Status: Abnormal   Collection Time: 11/29/17  7:13 PM  Result Value Ref Range   Sodium 140 135 - 145 mmol/L   Potassium 4.6 3.5 - 5.1 mmol/L   Chloride 107 101 - 111 mmol/L   CO2 22 22 - 32 mmol/L   Glucose,  Bld 94 65 - 99 mg/dL   BUN 55 (H) 6 - 20 mg/dL   Creatinine, Ser 2.25 (H) 0.44 - 1.00 mg/dL   Calcium 8.9 8.9 - 10.3 mg/dL   Total Protein 6.6 6.5 - 8.1 g/dL   Albumin 3.3 (L) 3.5 - 5.0 g/dL   AST 15 15 - 41 U/L   ALT 12 (L) 14 - 54 U/L   Alkaline Phosphatase 136 (H) 38 - 126 U/L   Total Bilirubin 0.3 0.3 - 1.2 mg/dL   GFR calc non Af Amer 19 (L) >60 mL/min   GFR calc Af Amer 22 (L) >60 mL/min    Comment: (NOTE) The eGFR has been calculated using the CKD EPI equation. This calculation has not been validated in all clinical situations. eGFR's persistently <60 mL/min signify possible Chronic Kidney Disease.    Anion gap 11 5 - 15    Comment: Performed at [facility], [place] [address] [place], [place] [address]  Reticulocytes     Status: Abnormal   Collection Time: 11/29/17  7:13 PM  Result Value Ref Range   Retic Ct Pct 4.7 (H) 0.4 - 3.1 %   RBC. 2.84 (L) 3.87 - 5.11 MIL/uL   Retic Count, Absolute 133.5 19.0 - 186.0 K/uL    Comment: Performed at [facility], [place] [address] [place], [place] [address]  I-stat troponin, ED     Status: None   Collection Time: 11/29/17  7:23 PM  Result Value Ref Range   Troponin i, poc 0.01 0.00 - 0.08 ng/mL   Comment 3            Comment: Due to the release kinetics of cTnI, a negative result within the first hours of the onset of symptoms does not rule out myocardial infarction with certainty. If myocardial infarction is still suspected, repeat the test at appropriate intervals.    Dg Chest 2 View  Result Date: 11/29/2017 CLINICAL DATA:  Shortness of Breath EXAM: CHEST - 2 VIEW COMPARISON:  None. FINDINGS: There is cardiomegaly with pulmonary venous hypertension. There are small pleural effusions bilaterally. There is patchy airspace opacity in the right mid and lower lung zones. There is mild underlying interstitial pulmonary edema. There is aortic atherosclerosis. No evident adenopathy. 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 IMPRESSION: Findings felt to be indicative of a degree of congestive heart failure. There is airspace consolidation in portions of the right mid lower lung zones. Suspect superimposed pneumonia, although a degree of alveolar edema could present similarly. Both entities may exist concurrently. There is aortic atherosclerosis. 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 Aortic Atherosclerosis (ICD10-I70.0). Electronically Signed   By: Lowella Grip III M.D.   On: 11/29/2017 20:27    Pending Labs Unresulted Labs (From admission, onward)   Start     Ordered   11/29/17 1759  Vitamin B12  (Anemia Panel (PNL))  STAT,   STAT     11/29/17 1758   11/29/17 1759  Folate  (Anemia Panel (PNL))  STAT,   STAT     11/29/17 1758   11/29/17 1759  Iron and TIBC  (Anemia Panel (PNL))  STAT,   STAT     11/29/17 1758   11/29/17 1759  Ferritin  (Anemia Panel (PNL))  STAT,   STAT     11/29/17 1758      Vitals/Pain Today's Vitals   11/29/17 2000 11/29/17 2050 11/29/17 2100 11/29/17 2107  BP: (!) 165/64 (!) 156/61 (!) 155/62 (!) 146/53  Pulse: 94 86  97  Resp: (!) 27 17 (!) 24 17  Temp:  98.6 F (37 C)  98.9 F (37.2 C)  TempSrc:  Oral  Oral  SpO2: 92% 94%  93%    Isolation Precautions No active isolations  Medications Medications  0.9 %  sodium chloride infusion (10 mL/hr Intravenous New Bag/Given 11/29/17 2036)  pantoprazole (PROTONIX) 80 mg in sodium chloride 0.9 % 100 mL IVPB (0 mg Intravenous Stopped 11/29/17 2050)    Mobility nonambulatory

## 2017-11-29 NOTE — ED Notes (Signed)
Bed: WA21 Expected date:  Expected time:  Means of arrival:  Comments: Low Hgb 5.9

## 2017-11-29 NOTE — ED Triage Notes (Signed)
Patient coming from nursing home with low hgb of 5.9. Pt lab drawn Friday.Nursing home sent her here today for transfusion. Patient normally on oxygen on 2 L at the facility continuesly . But is currently on 4L of oxygen at this time

## 2017-11-29 NOTE — H&P (Addendum)
History and Physical    Sherilynn Dieu JKK:[id number] DOB: 18-Mar-1935 DOA: 11/29/2017  PCP: Earlyne Iba, MD  Patient coming from: Nursing home.  Chief Complaint: Low hemoglobin.  HPI: Eileen Cobb is a 83 y.o. female with history of CHF, hypertension, diabetes mellitus, anemia and previous history of GI bleed was treated conservatively with transfusion as patient was found to be at high risk for any procedure was brought in from the nursing home after patient's hemoglobin was found to be around 5.9.  Patient's recent hemoglobin done at the nursing home was around 7.12 weeks ago which has dropped to 5.9.  Patient also has benign further worsening shortness of breath last 2 weeks with patient stating that she also has been exam nausea vomiting unable to keep in anything.  Patient denies any abdominal pain or diarrhea.  ED Course: In the ER patient is found to be hypoxic with a hemoglobin around 6.  Chest x-ray shows congestion concerning for CHF and possible pneumonia.  Patient stool for occult blood was positive.  Dr. Judith Part on-call gastroenterologist was consulted and will seen patient in consult.  2 units of PRBC has been ordered.  And I have also started patient on antibiotics for possible pneumonia and ordered 1 dose of Lasix IV because patient appeared to be fluid overloaded.  Abdomen on exam appears benign.  LFTs are normal.  Review of Systems: As per HPI, rest all negative.   Past Medical History:  Diagnosis Date  . Bone cancer ([place])   . Depression   . Essential hypertension   . GERD (gastroesophageal reflux disease)   . Gout   . HLD (hyperlipidemia)   . Type II diabetes mellitus with renal manifestations [facility])     Past Surgical History:  Procedure Laterality Date  . ABOVE KNEE LEG AMPUTATION Right      reports that  has never smoked. she has never used smokeless tobacco. She reports that she does not drink alcohol or use drugs.  No Known Allergies  Family  History  Problem Relation Age of Onset  . Hypertension Other     Prior to Admission medications   Medication Sig Start Date End Date Taking? Authorizing Provider  acetaminophen (TYLENOL) 325 MG tablet Take 650 mg by mouth 2 (two) times daily as needed for mild pain.   Yes [provider]  albuterol (PROVENTIL) (2.5 MG/3ML) 0.083% nebulizer solution Take 2.5 mg by nebulization every 4 (four) hours as needed for wheezing or shortness of breath.   Yes [provider]  allopurinol (ZYLOPRIM) 100 MG tablet Take 100 mg by mouth 2 (two) times daily. 06/24/17  Yes [provider]  amLODipine (NORVASC) 5 MG tablet Take 5 mg by mouth daily. 06/09/17  Yes [provider]  atorvastatin (LIPITOR) 20 MG tablet Take 20 mg by mouth daily. 06/18/17  Yes [provider]  docusate sodium (COLACE) 100 MG capsule Take 100 mg by mouth daily.   Yes [provider]  DULoxetine (CYMBALTA) 30 MG capsule Take 30 mg by mouth daily. 06/09/17  Yes [provider]  Emollient (EUCERIN) lotion Apply 1 mL topically 2 (two) times daily. Apply to body and extremities twice daily   Yes [provider]  epoetin alfa (EPOGEN,PROCRIT) 71696 UNIT/ML injection Inject 20,000 Units into the skin once a week. On Saturdays   Yes [provider]  ferrous sulfate 325 (65 FE) MG tablet Take 325 mg by mouth 2 (two) times daily with a meal.  Yes [provider]  folic acid (FOLVITE) 1 MG tablet Take 1 mg by mouth daily.   Yes [provider]  furosemide (LASIX) 40 MG tablet Take 40 mg by mouth daily. 06/18/17  Yes [provider]  hydrALAZINE (APRESOLINE) 25 MG tablet Take 25 mg by mouth every 6 (six) hours. 04/28/17  Yes [provider]  insulin NPH Human (HUMULIN N,NOVOLIN N) 100 UNIT/ML injection Inject 2-8 Units into the skin 2 (two) times daily as needed (high blood sugar). SLIDING SCALE BEFORE MEALS-CBG 201-250=2 UNITS, 251-300=4  UNITS, 301-350=6 UNITS, 351-400=8 UNITS, OVER 400 CALL MD, LESS THAN 60=ORANGE JUICE AND RECHECK IN 30 MINUTES    Yes [provider]  multivitamin-iron-minerals-folic acid (CENTRUM) chewable tablet Chew 1 tablet by mouth daily.   Yes [provider]  omeprazole (PRILOSEC) 20 MG capsule Take 20 mg by mouth 2 (two) times daily.   Yes [provider]  Propylene Glycol (SYSTANE BALANCE OP) Place 1 drop into both eyes 2 (two) times daily. FOR DRY EYE SYNDROME   Yes [provider]  SYMBICORT 160-4.5 MCG/ACT inhaler Take 2 puffs by mouth 2 (two) times daily. 06/06/17  Yes [provider]  vitamin C (ASCORBIC ACID) 500 MG tablet Take 500 mg by mouth daily.   Yes [provider]  polyethylene glycol (MIRALAX) packet Take 17 g by mouth daily as needed for mild constipation. Patient not taking: Reported on 11/29/2017 07/05/17   Barton Dubois, MD    Physical Exam: Vitals:   11/29/17 2000 11/29/17 2050 11/29/17 2100 11/29/17 2107  BP: (!) 165/64 (!) 156/61 (!) 155/62 (!) 146/53  Pulse: 94 86  97  Resp: (!) 27 17 (!) 24 17  Temp:  98.6 F (37 C)  98.9 F (37.2 C)  TempSrc:  Oral  Oral  SpO2: 92% 94%  93%      Constitutional: Moderately built and nourished. Vitals:   11/29/17 2000 11/29/17 2050 11/29/17 2100 11/29/17 2107  BP: (!) 165/64 (!) 156/61 (!) 155/62 (!) 146/53  Pulse: 94 86  97  Resp: (!) 27 17 (!) 24 17  Temp:  98.6 F (37 C)  98.9 F (37.2 C)  TempSrc:  Oral  Oral  SpO2: 92% 94%  93%   Eyes: Anicteric no pallor. ENMT: No discharge from the ears eyes nose or mouth. Neck: No mass felt.  No JVD appreciable. Respiratory: No rhonchi or mild crepitations. Cardiovascular: S1-S2 heard. Abdomen: Soft nontender bowel sounds present. Musculoskeletal: No edema.  Left AKA. Skin: No rash. Neurologic: Alert awake oriented to time place and person. Psychiatric: Appears normal.   Labs on Admission: I have personally reviewed  following labs and imaging studies  CBC: Recent Labs  Lab 11/29/17 1913  WBC 10.9*  NEUTROABS 9.1*  HGB 6.5*  HCT 21.7*  MCV 76.4*  PLT 811*   Basic Metabolic Panel: Recent Labs  Lab 11/29/17 1913  NA 140  K 4.6  CL 107  CO2 22  GLUCOSE 94  BUN 55*  CREATININE 2.25*  CALCIUM 8.9   GFR: CrCl cannot be calculated (Unknown ideal weight.). Liver Function Tests: Recent Labs  Lab 11/29/17 1913  AST 15  ALT 12*  ALKPHOS 136*  BILITOT 0.3  PROT 6.6  ALBUMIN 3.3*   No results for input(s): LIPASE, AMYLASE in the last 168 hours. No results for input(s): AMMONIA in the last 168 hours. Coagulation Profile: No results for input(s): INR, PROTIME in the last 168 hours. Cardiac Enzymes: No results for  input(s): CKTOTAL, CKMB, CKMBINDEX, TROPONINI in the last 168 hours. BNP (last 3 results) No results for input(s): PROBNP in the last 8760 hours. HbA1C: No results for input(s): HGBA1C in the last 72 hours. CBG: No results for input(s): GLUCAP in the last 168 hours. Lipid Profile: No results for input(s): CHOL, HDL, LDLCALC, TRIG, CHOLHDL, LDLDIRECT in the last 72 hours. Thyroid Function Tests: No results for input(s): TSH, T4TOTAL, FREET4, T3FREE, THYROIDAB in the last 72 hours. Anemia Panel: Recent Labs    11/29/17 1913  RETICCTPCT 4.7*   Urine analysis: No results found for: COLORURINE, APPEARANCEUR, LABSPEC, PHURINE, GLUCOSEU, HGBUR, BILIRUBINUR, KETONESUR, PROTEINUR, UROBILINOGEN, NITRITE, LEUKOCYTESUR Sepsis Labs: @LABRCNTIP (procalcitonin:4,lacticidven:4) )No results found for this or any previous visit (from the past 240 hour(s)).   Radiological Exams on Admission: Dg Chest 2 View  Result Date: 11/29/2017 CLINICAL DATA:  Shortness of Breath EXAM: CHEST - 2 VIEW COMPARISON:  None. FINDINGS: There is cardiomegaly with pulmonary venous hypertension. There are small pleural effusions bilaterally. There is patchy airspace opacity in the right mid and lower lung  zones. There is mild underlying interstitial pulmonary edema. There is aortic atherosclerosis. No evident adenopathy. 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 IMPRESSION: Findings felt to be indicative of a degree of congestive heart failure. There is airspace consolidation in portions of the right mid lower lung zones. Suspect superimposed pneumonia, although a degree of alveolar edema could present similarly. Both entities may exist concurrently. There is aortic atherosclerosis. 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 Aortic Atherosclerosis (ICD10-I70.0). Electronically Signed   By: Lowella Grip III M.D.   On: 11/29/2017 20:27    EKG: Independently reviewed.  Normal sinus rhythm with PVCs and IVCD.  Assessment/Plan Principal Problem:   Acute GI bleeding Active Problems:   Essential hypertension   Type II diabetes mellitus with renal manifestations (HCC)   Depression   Stage 4 chronic kidney disease (HCC)   Acute blood loss anemia    1. Acute respiratory failure with hypoxia likely a combination of CHF possible pneumonia and anemia -patient is receiving 2 units of PRBC for anemia secondary to GI bleed.  I have ordered Lasix 40 g IV 1 dose now and daily.  Patient states she has been throwing up for last 2-3 days for which I have placed patient on empiric antibiotics since patient's chest x-ray also shows possibility of pneumonia.  A pro calcitonin levels are negative may discontinue antibiotics.  Since we do not have any records of patient's EF I have ordered 2D echo. 2. Acute GI bleed with anemia -patient is placed on Protonix.  During last admission in October patient was felt to be at increased risk for any procedure.  Patient was treated conservatively.  Dr. Carlean Purl on-call gastroenterologist been consulted. 3. Nausea vomiting -patient states she has been having poor appetite with nausea vomiting last couple of days.  Denies any  abdominal pain.  Will check CT abdomen and pelvis.  Abdomen on exam appears benign. 4. Hypertension on hydralazine and amlodipine.  Will keep also patient on PRN IV hydralazine since blood pressure remains to be uncontrolled. 5. Diabetes mellitus type 2 - we will keep patient on sliding scale coverage. 6. History of gout on allopurinol. 7. Chronic kidney disease stage IV -closely follow metabolic panel. 8. Hyperlipidemia on statins. 9. Left AKA. 10. History of asthma on inhalers. 11. History of depression.  Addendum -patient became more short of breath and an additional dose of 60  mg IV Lasix was given early in the night.  Despite which patient is still short of breath and I have reexamined the patient 7:35 AM November 30, 2017.  I have ordered chest x-ray and ABG and will place patient on BiPAP.  Patient states he does not want to be intubated or have any CPR and wants to be an also declines any dialysis.  Another dose of Lasix have been ordered we will transfer to stepdown unit.  Oncoming hospitalist updated. Unable to reach patients son Takita Riecke with the number provided.   DVT prophylaxis: SCDs. Code Status:  Family Communication: Discussed with patient. Disposition Plan: Back to nursing home. Consults called: Gastroenterologist. Admission status: Observation.   [name] MD [facility] Pager [phone].  If 7PM-7AM, please contact night-coverage [email or web address] Password [facility]  11/29/2017, 9:46 PM

## 2017-11-30 ENCOUNTER — Observation Stay (HOSPITAL_COMMUNITY): Payer: Medicare Other

## 2017-11-30 DIAGNOSIS — D509 Iron deficiency anemia, unspecified: Secondary | ICD-10-CM | POA: Diagnosis not present

## 2017-11-30 DIAGNOSIS — R110 Nausea: Secondary | ICD-10-CM

## 2017-11-30 DIAGNOSIS — I5033 Acute on chronic diastolic (congestive) heart failure: Secondary | ICD-10-CM | POA: Diagnosis present

## 2017-11-30 DIAGNOSIS — R195 Other fecal abnormalities: Secondary | ICD-10-CM | POA: Diagnosis not present

## 2017-11-30 DIAGNOSIS — J189 Pneumonia, unspecified organism: Secondary | ICD-10-CM | POA: Diagnosis present

## 2017-11-30 DIAGNOSIS — D631 Anemia in chronic kidney disease: Secondary | ICD-10-CM | POA: Diagnosis present

## 2017-11-30 DIAGNOSIS — Z66 Do not resuscitate: Secondary | ICD-10-CM | POA: Diagnosis present

## 2017-11-30 DIAGNOSIS — Z79899 Other long term (current) drug therapy: Secondary | ICD-10-CM | POA: Diagnosis not present

## 2017-11-30 DIAGNOSIS — E785 Hyperlipidemia, unspecified: Secondary | ICD-10-CM | POA: Diagnosis present

## 2017-11-30 DIAGNOSIS — D62 Acute posthemorrhagic anemia: Secondary | ICD-10-CM | POA: Diagnosis present

## 2017-11-30 DIAGNOSIS — Z8249 Family history of ischemic heart disease and other diseases of the circulatory system: Secondary | ICD-10-CM | POA: Diagnosis not present

## 2017-11-30 DIAGNOSIS — Z89611 Acquired absence of right leg above knee: Secondary | ICD-10-CM | POA: Diagnosis not present

## 2017-11-30 DIAGNOSIS — E1122 Type 2 diabetes mellitus with diabetic chronic kidney disease: Secondary | ICD-10-CM | POA: Diagnosis present

## 2017-11-30 DIAGNOSIS — N179 Acute kidney failure, unspecified: Secondary | ICD-10-CM | POA: Diagnosis present

## 2017-11-30 DIAGNOSIS — Z794 Long term (current) use of insulin: Secondary | ICD-10-CM | POA: Diagnosis not present

## 2017-11-30 DIAGNOSIS — K219 Gastro-esophageal reflux disease without esophagitis: Secondary | ICD-10-CM | POA: Diagnosis present

## 2017-11-30 DIAGNOSIS — N184 Chronic kidney disease, stage 4 (severe): Secondary | ICD-10-CM | POA: Diagnosis present

## 2017-11-30 DIAGNOSIS — J440 Chronic obstructive pulmonary disease with acute lower respiratory infection: Secondary | ICD-10-CM | POA: Diagnosis present

## 2017-11-30 DIAGNOSIS — R0602 Shortness of breath: Secondary | ICD-10-CM | POA: Diagnosis not present

## 2017-11-30 DIAGNOSIS — M109 Gout, unspecified: Secondary | ICD-10-CM | POA: Diagnosis present

## 2017-11-30 DIAGNOSIS — I130 Hypertensive heart and chronic kidney disease with heart failure and stage 1 through stage 4 chronic kidney disease, or unspecified chronic kidney disease: Secondary | ICD-10-CM | POA: Diagnosis present

## 2017-11-30 DIAGNOSIS — Z7951 Long term (current) use of inhaled steroids: Secondary | ICD-10-CM | POA: Diagnosis not present

## 2017-11-30 DIAGNOSIS — E8770 Fluid overload, unspecified: Secondary | ICD-10-CM | POA: Diagnosis present

## 2017-11-30 DIAGNOSIS — F329 Major depressive disorder, single episode, unspecified: Secondary | ICD-10-CM | POA: Diagnosis present

## 2017-11-30 DIAGNOSIS — G8929 Other chronic pain: Secondary | ICD-10-CM | POA: Diagnosis present

## 2017-11-30 DIAGNOSIS — K922 Gastrointestinal hemorrhage, unspecified: Secondary | ICD-10-CM | POA: Diagnosis present

## 2017-11-30 DIAGNOSIS — D500 Iron deficiency anemia secondary to blood loss (chronic): Secondary | ICD-10-CM

## 2017-11-30 DIAGNOSIS — I361 Nonrheumatic tricuspid (valve) insufficiency: Secondary | ICD-10-CM

## 2017-11-30 DIAGNOSIS — Z85830 Personal history of malignant neoplasm of bone: Secondary | ICD-10-CM | POA: Diagnosis not present

## 2017-11-30 DIAGNOSIS — J9621 Acute and chronic respiratory failure with hypoxia: Secondary | ICD-10-CM | POA: Diagnosis present

## 2017-11-30 DIAGNOSIS — J9811 Atelectasis: Secondary | ICD-10-CM | POA: Diagnosis present

## 2017-11-30 LAB — BASIC METABOLIC PANEL
Anion gap: 14 (ref 5–15)
CO2: 21 mmol/L — ABNORMAL LOW (ref 22–32)
Chloride: 104 mmol/L (ref 101–111)
GFR calc Af Amer: 23 mL/min — ABNORMAL LOW (ref >60)
Potassium: 4.4 mmol/L (ref 3.5–5.1)
Sodium: 139 mmol/L (ref 135–145)

## 2017-11-30 LAB — GLUCOSE, CAPILLARY
GLUCOSE-CAPILLARY: 160 mg/dL — AB (ref 65–99)
Glucose-Capillary: 112 mg/dL — ABNORMAL HIGH (ref 65–99)
Glucose-Capillary: 74 mg/dL (ref 65–99)
Glucose-Capillary: 82 mg/dL (ref 65–99)

## 2017-11-30 LAB — BASIC METABOLIC PANEL WITH GFR
BUN: 52 mg/dL — ABNORMAL HIGH (ref 6–20)
Calcium: 8.9 mg/dL (ref 8.9–10.3)
Creatinine, Ser: 2.17 mg/dL — ABNORMAL HIGH (ref 0.44–1.00)
GFR calc non Af Amer: 20 mL/min — ABNORMAL LOW (ref >60)
Glucose, Bld: 106 mg/dL — ABNORMAL HIGH (ref 65–99)

## 2017-11-30 LAB — BLOOD GAS, ARTERIAL
ACID-BASE DEFICIT: 4.7 mmol/L — AB (ref 0.0–2.0)
BICARBONATE: 20.9 mmol/L (ref 20.0–28.0)
DRAWN BY: 257701
O2 Content: 4 L/min
O2 SAT: 87.9 %
PATIENT TEMPERATURE: 98.6
pCO2 arterial: 43.8 mmHg (ref 32.0–48.0)
pH, Arterial: 7.3 — ABNORMAL LOW (ref 7.350–7.450)
pO2, Arterial: 57.8 mmHg — ABNORMAL LOW (ref 83.0–108.0)

## 2017-11-30 LAB — IRON AND TIBC
Iron: 14 ug/dL — ABNORMAL LOW (ref 28–170)
Saturation Ratios: 5 % — ABNORMAL LOW (ref 10.4–31.8)
TIBC: 295 ug/dL (ref 250–450)
UIBC: 281 ug/dL

## 2017-11-30 LAB — BRAIN NATRIURETIC PEPTIDE: B Natriuretic Peptide: 855.4 pg/mL — ABNORMAL HIGH (ref 0.0–100.0)

## 2017-11-30 LAB — PROCALCITONIN: Procalcitonin: 0.77 ng/mL

## 2017-11-30 LAB — VITAMIN B12: Vitamin B-12: 1437 pg/mL — ABNORMAL HIGH (ref 180–914)

## 2017-11-30 LAB — CBC
HCT: 30.7 % — ABNORMAL LOW (ref 36.0–46.0)
Hemoglobin: 9.6 g/dL — ABNORMAL LOW (ref 12.0–15.0)
MCH: 25 pg — ABNORMAL LOW (ref 26.0–34.0)
MCHC: 31.3 g/dL (ref 30.0–36.0)
MCV: 79.9 fL (ref 78.0–100.0)
Platelets: 504 K/uL — ABNORMAL HIGH (ref 150–400)
RBC: 3.84 MIL/uL — ABNORMAL LOW (ref 3.87–5.11)
RDW: 19.8 % — ABNORMAL HIGH (ref 11.5–15.5)
WBC: 21.2 10*3/uL — ABNORMAL HIGH (ref 4.0–10.5)

## 2017-11-30 LAB — MRSA PCR SCREENING: MRSA BY PCR: POSITIVE — AB

## 2017-11-30 LAB — ECHOCARDIOGRAM COMPLETE
Height: 62 in
WEIGHTICAEL: 3079.39 [oz_av]

## 2017-11-30 LAB — TROPONIN I: Troponin I: 0.04 ng/mL (ref <0.03)

## 2017-11-30 LAB — FERRITIN: Ferritin: 20 ng/mL (ref 11–307)

## 2017-11-30 LAB — FOLATE: Folate: 64.2 ng/mL (ref >5.9)

## 2017-11-30 MED ORDER — VANCOMYCIN HCL 500 MG IV SOLR: 500.0000 mg | INTRAVENOUS | Status: DC

## 2017-11-30 MED ORDER — IOPAMIDOL (ISOVUE-300) INJECTION 61%: 30.0000 mL | Freq: Once | INTRAVENOUS | Status: DC | PRN

## 2017-11-30 MED ORDER — FUROSEMIDE 10 MG/ML IJ SOLN: 40.0000 mg | Freq: Every day | INTRAMUSCULAR | Status: DC

## 2017-11-30 MED ORDER — CHLORHEXIDINE GLUCONATE CLOTH 2 % EX PADS
6.0000 | MEDICATED_PAD | Freq: Every day | CUTANEOUS | Status: AC
  Administered 2017-11-30 – 2017-12-03 (×5): 6 via TOPICAL

## 2017-11-30 MED ORDER — VANCOMYCIN HCL 500 MG IV SOLR
500.0000 mg | INTRAVENOUS | Status: DC
  Administered 2017-11-30 – 2017-12-02 (×2): 500 mg via INTRAVENOUS
  Filled 2017-11-30 (×3): qty 500

## 2017-11-30 MED ORDER — DEXTROSE 5 % IV SOLN
1000.0000 mg | Freq: Two times a day (BID) | INTRAVENOUS | Status: DC
  Filled 2017-11-30: qty 1000

## 2017-11-30 MED ORDER — PIPERACILLIN-TAZOBACTAM 3.375 G IVPB
3.3750 g | Freq: Three times a day (TID) | INTRAVENOUS | Status: DC
  Administered 2017-11-30 – 2017-12-03 (×9): 3.375 g via INTRAVENOUS
  Filled 2017-11-30 (×9): qty 50

## 2017-11-30 MED ORDER — FUROSEMIDE 10 MG/ML IJ SOLN
10.0000 mg/h | INTRAVENOUS | Status: DC
  Administered 2017-11-30 – 2017-12-01 (×2): 10 mg/h via INTRAVENOUS
  Filled 2017-11-30: qty 20
  Filled 2017-11-30 (×2): qty 25

## 2017-11-30 MED ORDER — FUROSEMIDE 10 MG/ML IJ SOLN
60.0000 mg | Freq: Two times a day (BID) | INTRAMUSCULAR | Status: DC
  Administered 2017-11-30: 60 mg via INTRAVENOUS
  Filled 2017-11-30: qty 6

## 2017-11-30 MED ORDER — HYDRALAZINE HCL 20 MG/ML IJ SOLN
10.0000 mg | INTRAMUSCULAR | Status: AC
  Administered 2017-11-30: 10 mg via INTRAVENOUS
  Filled 2017-11-30: qty 1

## 2017-11-30 MED ORDER — METOLAZONE 5 MG PO TABS
10.0000 mg | ORAL_TABLET | Freq: Two times a day (BID) | ORAL | Status: DC
  Administered 2017-11-30 – 2017-12-03 (×8): 10 mg via ORAL
  Filled 2017-11-30 (×8): qty 2

## 2017-11-30 MED ORDER — CHLORHEXIDINE GLUCONATE 0.12 % MT SOLN
15.0000 mL | Freq: Two times a day (BID) | OROMUCOSAL | Status: DC
  Administered 2017-12-01 – 2017-12-07 (×12): 15 mL via OROMUCOSAL
  Filled 2017-11-30 (×14): qty 15

## 2017-11-30 MED ORDER — HYDRALAZINE HCL 20 MG/ML IJ SOLN
10.0000 mg | INTRAMUSCULAR | Status: DC | PRN
  Administered 2017-12-01: 10 mg via INTRAVENOUS
  Filled 2017-11-30 (×4): qty 1

## 2017-11-30 MED ORDER — IOPAMIDOL (ISOVUE-300) INJECTION 61%
INTRAVENOUS | Status: AC
  Filled 2017-11-30: qty 30

## 2017-11-30 MED ORDER — ORAL CARE MOUTH RINSE
15.0000 mL | Freq: Two times a day (BID) | OROMUCOSAL | Status: DC
  Administered 2017-12-01 – 2017-12-07 (×11): 15 mL via OROMUCOSAL

## 2017-11-30 MED ORDER — HYDRALAZINE HCL 20 MG/ML IJ SOLN
5.0000 mg | INTRAMUSCULAR | Status: DC | PRN
  Administered 2017-11-30 (×3): 5 mg via INTRAVENOUS
  Filled 2017-11-30 (×3): qty 1

## 2017-11-30 MED ORDER — MUPIROCIN 2 % EX OINT
1.0000 "application " | TOPICAL_OINTMENT | Freq: Two times a day (BID) | CUTANEOUS | Status: AC
  Administered 2017-11-30 – 2017-12-04 (×9): 1 via NASAL
  Filled 2017-11-30 (×2): qty 22

## 2017-11-30 MED ORDER — FUROSEMIDE 10 MG/ML IJ SOLN
60.0000 mg | Freq: Once | INTRAMUSCULAR | Status: AC
  Administered 2017-11-30: 60 mg via INTRAVENOUS
  Filled 2017-11-30: qty 6

## 2017-11-30 NOTE — Progress Notes (Signed)
PROGRESS NOTE    Eileen Cobb  XTG:[id number] DOB: Sep 18, 1934 DOA: 11/29/2017 PCP: Earlyne Iba, MD (  Brief Narrative:  83 year old with past medical history relevant for stage IV chronic kidney disease, chronic anemia due to multiple hypoproliferative causes, hypertension, gout, hyperlipidemia, type 2 diabetes on insulin who comes in from nursing home found to be anemic and short of breath.  Her course has been complicated by acute likely diastolic heart failure exacerbation possibly secondary to high output heart failure vs TACO vs transfusion reaction/TRALI.    Assessment & Plan:   Principal Problem:   Acute on chronic diastolic CHF (congestive heart failure) (HCC) Active Problems:   Essential hypertension   Type II diabetes mellitus with renal manifestations (HCC)   Depression   Stage 4 chronic kidney disease (HCC)   Chronic blood loss anemia   #) Acute hypoxic respiratory failure: Differential diagnosis at this time is broad.  It includes acute on chronic diastolic heart failure exacerbation secondary to volume from blood transfusion, high output heart failure from anemia, transfusion associated circulatory overload, transfusion related acute lung injury versus possible pneumonia noted on chest x-ray..  She is requiring BiPAP and is been transferred to the ICU.  She is again reiterated that she does not want intubation and mechanical ventilation or CPR.  Unfortunately she has not responded to approximately 100 mg of IV furosemide at this time. -Continue vancomycin and Zosyn started 11/30/2017 -Start furosemide drip at 10 mg/h, add metolazone 10 mg twice daily, recheck electrolytes twice a day -Per recommendations on chest x-ray we will obtain CT scan of the chest due to concern for possible hilar lymphadenopathy and history of "bone cancer" -We will discuss with patient's son about her poor prognosis -N.p.o., fluid restriction, weigh daily, strict ins and  outs -Palliative care consulted -Pending echo  #) Acute on chronic anemia: Patient is a long history of anemia of unclear etiology.  She has been deemed not a good candidate to have endoscopic evaluation due to her medical comorbidities.  She is also thought to have a component of hypoproliferative anemia secondary to her stage IV chronic kidney disease.  Unfortunately it appears she developed a complication from blood transfusion and is being supported through this -Monitor labs, judicious use of transfusion we will transfuse over 4 hours and give aggressive diuresis -Hold Depo injections weekly -Continue ferrous sulfate 325 twice a day with meals  #) Hypertension: -Continue amlodipine 5 mg -Continue hydralazine 25 mg every 6 hours -Hold home oral furosemide 40 mg daily  #) Type 2 diabetes on insulin: -Sliding scale insulin here -N.p.o. while on BiPAP  #) Gout: -Continue allopurinol 100 mg twice a day  #) Hyperlipidemia: -Continue atorvastatin 20 mg daily  #) COPD: -Continue L ABA/ICS  #) Pain: -Continue duloxetine 30 mg daily  #) Bone cancer status post AKA: Suspect likely osteosarcoma based on description however it would be unusual in this age group.  Fluids: Hold IV fluids Electrolyte: Monitor and supplement Nutrition: N.p.o. while on BiPAP  Prophylaxis: SCDs  Disposition: Pending improvement of respiratory status versus discussion of goals of care  DO NOT RESUSCITATE   Consultants:   Palliative care  Procedures: (Don't include imaging studies which can be auto populated. Include things that cannot be auto populated i.e. Echo, Carotid and venous dopplers, Foley, Bipap, HD, tubes/drains, wound vac, central lines etc)  Echo 11/30/2017 pending  Antimicrobials: (specify start and planned stop date. Auto populated tables are space occupying and do not give end  dates)  Vancomycin and Zosyn started 11/30/2017   Subjective: Patient decompensated this morning and  did not respond to IV furosemide with increasing tachypnea and respiratory distress.  Chest x-ray showed possible asymmetric edema.  Patient was placed on BiPAP but she did not wish to be intubated.  ABG showed acute hypoxic respiratory failure.  Patient cannot give much of a history at this time due to being in extremis.  Patient's son is unable to be contacted.  Objective: Vitals:   11/30/17 1014 11/30/17 1100 11/30/17 1138 11/30/17 1147  BP:      Pulse: (!) 101 (!) 108 (!) 102 (!) 102  Resp: (!) 31 (!) 31 (!) 23 (!) 23  Temp:      TempSrc:      SpO2: 96% 97% 98% 98%  Weight:      Height:        Intake/Output Summary (Last 24 hours) at 11/30/2017 1214 Last data filed at 11/30/2017 8299 Gross per 24 hour  Intake 973.75 ml  Output 1000 ml  Net -26.25 ml   Filed Weights   11/29/17 2325  Weight: 87.3 kg (192 lb 7.4 oz)    Examination:  General exam: Dyspneic, in acute distress Respiratory system: Diminished lung sounds in all extremities, crackles throughout, no wheezes, scattered rhonchi Cardiovascular system: Distant heart sounds, regular Gastrointestinal system: Soft, nontender, nondistended, diminished bowel sounds Central nervous system: Alert, moving all extremities Extremities: 1+ lower extremity edema, status post AKA Skin: No rashes on visible skin Psychiatry: Unable to assess due to patient condition    Data Reviewed: I have personally reviewed following labs and imaging studies  CBC: Recent Labs  Lab 11/29/17 1913  WBC 10.9*  NEUTROABS 9.1*  HGB 6.5*  HCT 21.7*  MCV 76.4*  PLT 371*   Basic Metabolic Panel: Recent Labs  Lab 11/29/17 1913  NA 140  K 4.6  CL 107  CO2 22  GLUCOSE 94  BUN 55*  CREATININE 2.25*  CALCIUM 8.9   GFR: Estimated Creatinine Clearance: 20.1 mL/min (A) (by C-G formula based on SCr of 2.25 mg/dL (H)). Liver Function Tests: Recent Labs  Lab 11/29/17 1913  AST 15  ALT 12*  ALKPHOS 136*  BILITOT 0.3  PROT 6.6  ALBUMIN  3.3*   No results for input(s): LIPASE, AMYLASE in the last 168 hours. No results for input(s): AMMONIA in the last 168 hours. Coagulation Profile: No results for input(s): INR, PROTIME in the last 168 hours. Cardiac Enzymes: No results for input(s): CKTOTAL, CKMB, CKMBINDEX, TROPONINI in the last 168 hours. BNP (last 3 results) No results for input(s): PROBNP in the last 8760 hours. HbA1C: No results for input(s): HGBA1C in the last 72 hours. CBG: Recent Labs  Lab 11/29/17 2209 11/29/17 2357 11/30/17 0747  GLUCAP 88 90 112*   Lipid Profile: No results for input(s): CHOL, HDL, LDLCALC, TRIG, CHOLHDL, LDLDIRECT in the last 72 hours. Thyroid Function Tests: No results for input(s): TSH, T4TOTAL, FREET4, T3FREE, THYROIDAB in the last 72 hours. Anemia Panel: Recent Labs    11/29/17 1913  RETICCTPCT 4.7*   Sepsis Labs: No results for input(s): PROCALCITON, LATICACIDVEN in the last 168 hours.  Recent Results (from the past 240 hour(s))  MRSA PCR Screening     Status: Abnormal   Collection Time: 11/29/17 11:58 PM  Result Value Ref Range Status   MRSA by PCR POSITIVE (A) NEGATIVE Final    Comment:        The GeneXpert MRSA Assay (FDA approved  for NASAL specimens only), is one component of a comprehensive MRSA colonization surveillance program. It is not intended to diagnose MRSA infection nor to guide or monitor treatment for MRSA infections. RESULT CALLED TO, READ BACK BY AND VERIFIED WITH: D BROWN 11/30/17 0226 RHOLMES Performed at [facility], [place] [address] [place], [place] [address]          Radiology Studies: Ct Abdomen Pelvis Wo Contrast  Result Date: 11/30/2017 CLINICAL DATA:  Nausea and vomiting. EXAM: CT ABDOMEN AND PELVIS WITHOUT CONTRAST TECHNIQUE: Multidetector CT imaging of the abdomen and pelvis was performed following the standard protocol without IV contrast. COMPARISON:  None. FINDINGS: Lower chest: Small to moderate bilateral  pleural effusions and adjacent compressive atelectasis. Mild cardiomegaly. Hepatobiliary: Motion artifact through the liver. No evidence of focal hepatic lesion. Small gallstones within physiologically distended gallbladder. No pericholecystic inflammation. No biliary dilatation. Pancreas: No ductal dilatation or inflammation. Spleen: Normal in size without focal abnormality. Adrenals/Urinary Tract: Mild left adrenal thickening without dominant nodule. Right kidney is not seen. No left hydronephrosis. No urolithiasis. Urinary bladder is partially distended. No bladder wall thickening. Stomach/Bowel: Patient motion limits bowel assessment. Stomach is nondistended. No small bowel wall thickening, inflammatory change or obstruction. Enteric contrast reaches the distal small bowel. Appendix tentatively visualized and normal. Moderate volume of stool throughout the colon. There is sigmoid colonic tortuosity. No colonic wall thickening or inflammatory change. Vascular/Lymphatic: Aorto bi-iliac atherosclerosis. No enlarged abdominal or pelvic lymph nodes. Reproductive: Enlarged uterus with multiple fibroids, many of which are calcified. No adnexal mass. Other: Generalized body wall edema. No ascites or free air. No intra-abdominal abscess. Tiny fat containing umbilical hernia. Musculoskeletal: Degenerative change in the lumbar spine. There are no acute or suspicious osseous abnormalities. Mild fatty infiltration of paraspinal musculature. IMPRESSION: 1. No acute abnormality in the abdomen/pelvis. 2. Uncomplicated cholelithiasis. 3. Incidental uterine fibroids. 4. Small to moderate bilateral pleural effusions and bibasilar atelectasis. Body wall edema suggest third-spacing. 5.  Aortic Atherosclerosis (ICD10-I70.0). Electronically Signed   By: Jeb Levering M.D.   On: 11/30/2017 03:45   Dg Chest 2 View  Result Date: 11/29/2017 CLINICAL DATA:  Shortness of Breath EXAM: CHEST - 2 VIEW COMPARISON:  None. FINDINGS: There  is cardiomegaly with pulmonary venous hypertension. There are small pleural effusions bilaterally. There is patchy airspace opacity in the right mid and lower lung zones. There is mild underlying interstitial pulmonary edema. There is aortic atherosclerosis. No evident adenopathy. 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 IMPRESSION: Findings felt to be indicative of a degree of congestive heart failure. There is airspace consolidation in portions of the right mid lower lung zones. Suspect superimposed pneumonia, although a degree of alveolar edema could present similarly. Both entities may exist concurrently. There is aortic atherosclerosis. 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 Aortic Atherosclerosis (ICD10-I70.0). Electronically Signed   By: Lowella Grip III M.D.   On: 11/29/2017 20:27   Dg Chest Port 1 View  Result Date: 11/30/2017 CLINICAL DATA:  Shortness of breath. EXAM: PORTABLE CHEST 1 VIEW COMPARISON:  11/29/2017 and CT 11/30/2017 FINDINGS: Lungs are adequately inflated demonstrate moderate right hilar and perihilar opacification without significant change. Mild prominence of the left perihilar markings unchanged. Suggestion of small bilateral pleural effusions as seen on the recent CT scan. Stable cardiomegaly. Remainder the exam is unchanged. IMPRESSION: Stable moderate right hilar perihilar opacification which may be due to asymmetric edema versus infection. Right hilar adenopathy or mass is possible.  Recommend chest CT with contrast for further evaluation. Small bilateral effusions and stable cardiomegaly. Electronically Signed   By: Marin Olp M.D.   On: 11/30/2017 08:04        Scheduled Meds: . allopurinol  100 mg Oral BID  . amLODipine  5 mg Oral Daily  . atorvastatin  20 mg Oral q1800  . chlorhexidine  15 mL Mouth Rinse BID  . Chlorhexidine Gluconate Cloth  6 each Topical Q0600  . DULoxetine  30 mg Oral Daily  .  ferrous sulfate  325 mg Oral BID WC  . folic acid  1 mg Oral Daily  . hydrALAZINE  25 mg Oral Q6H  . insulin aspart  0-9 Units Subcutaneous TID WC  . iopamidol      . lubriderm seriously sensitive  1 application Topical BID  . mouth rinse  15 mL Mouth Rinse q12n4p  . metolazone  10 mg Oral BID  . mometasone-formoterol  2 puff Inhalation BID  . multivitamin with minerals  1 tablet Oral Daily  . mupirocin ointment  1 application Nasal BID  . vitamin C  500 mg Oral Daily   Continuous Infusions: . furosemide (LASIX) infusion 10 mg/hr (11/30/17 0853)  . piperacillin-tazobactam (ZOSYN)  IV 3.375 g (11/30/17 1143)  . vancomycin       LOS: 0 days    Time spent: [place], MD [facility]   If 7PM-7AM, please contact night-coverage [email or web address] Password [facility] 11/30/2017, 12:14 PM

## 2017-11-30 NOTE — Progress Notes (Signed)
Palliative Medicine consult noted. Due to high referral volume, there may be a delay seeing this patient. Please call the Palliative Medicine Team office at [phone] if recommendations are needed in the interim.  Thank you for inviting [place] to see this patient.  Marjie Skiff Pernie Grosso, RN, BSN, [facility] Palliative Medicine Team 11/30/2017 11:59 AM Office [phone]

## 2017-11-30 NOTE — Progress Notes (Signed)
Pharmacy Antibiotic Note  Eileen Cobb is a 83 y.o. female admitted on 11/29/2017 with pneumonia.  Pharmacy has been consulted for Vancomycin and Zosyn dosing.  Plan: Zosyn 3.375g IV q8h (4 hour infusion).  Vancomycin 1gm iv x1, then 500mg  iv q36hr Goal AUC = 400 - 500 for all indications, except meningitis (goal AUC > 500 and Cmin 15-20 mcg/mL)   Height: 5\' 2"  (157.5 cm) Weight: 192 lb 7.4 oz (87.3 kg) IBW/kg (Calculated) : 50.1  Temp (24hrs), Avg:98.8 F (37.1 C), Min:98.6 F (37 C), Max:99 F (37.2 C)  Recent Labs  Lab 11/29/17 1913  WBC 10.9*  CREATININE 2.25*    Estimated Creatinine Clearance: 20.1 mL/min (A) (by C-G formula based on SCr of 2.25 mg/dL (H)).    No Known Allergies  Antimicrobials this admission: Vancomycin 11/30/2017 >> Zosyn 11/30/2017 >>   Dose adjustments this admission: -  Microbiology results: -  Thank you for allowing pharmacy to be a part of this patient's care.  Eileen Cobb 11/30/2017 12:58 AM

## 2017-11-30 NOTE — Progress Notes (Signed)
Po2 on ABG (PT on 4 lpm [place])= 57.8 (sp02 87.9). This RT asked RN Amber to increase PT 02 to 5 lpm [place].

## 2017-11-30 NOTE — Progress Notes (Signed)
Patient with episodes of SOB overnight and again this morning. After first unit of blood 40mg  of IV lasix given. MD notified of episode of respiratory distress around 01:45am and new order received for 60mg  of IV lasix and breathing treatment given to patient. This morning patient complaning of SOB and very anxious. Dr. Hal Hope to bedside and new orders received. Report given to day shift RN who will continue to monitor patient.

## 2017-11-30 NOTE — Progress Notes (Addendum)
After Hopsitalist provider arrived. Order given to administer 60 mg Furosemide stat. Portable chest xray completed respiratory at bedside. ABG drawn.Recommendations is for BiPAP. Daytime Hospitalist came to assess, will transfer pt to stepdown. VS 104, 29, 169/70, 95 % 5Lt.  Attempted to contact pts family, no answer, will follow up. Will continue to monitor pt.

## 2017-11-30 NOTE — Consult Note (Addendum)
Consultation  Referring Provider: triad hospitalist/[place] Primary Care Physician:  [name], Manya Silvas, MD Primary Gastroenterologist:  None/unassigned  Reason for Consultation:  Microcytic anemia severe/heme +  HPI: Eileen Cobb is a 83 y.o. female with multiple chronic serious medical problems,who is a nursing home resident. She was originally from [place] but was evacuated to [place] with [employer]. She has history of bone cancer is status post BKA, has history of hypertension, GERD, hyperlipidemia, adult-onset diabetes mellitus, and chronic kidney disease stage IV. I believe she also has chronic respiratory failure as she tells me she is on oxygen chronically. She had an admission here in October 2018 and was severely anemic at that time with hemoglobin of 4.1 and noticed to be heme positive without overt bleeding. She was transfused. She was seen in consultation by [facility] who did not feel that she was a good candidate for endoscopic evaluation at that time due to her multiple comorbidities. She was readmitted from nursing home last evening, noted to be more short of breath over the past couple of days and complaining of nausea and vomiting. Hemoglobin was also down from a baseline of around 7-5.9 when checked at the nursing home last week. Documented heme positive here. She has been hypoxic and initially was on 4 L of nasal cannula, requiring increased oxygen this morning and remains quite short of breath. Nursing states that she has been made a no code but is transferring to stepdown for BiPAP. She has no GI complaints, no further nausea or vomiting has no complaints of heartburn or indigestion no abdominal pain and has been unaware of any melena or hematochezia. She received 2 units of packed RBCs last evening, last hemoglobin was 6.5 and is pending this morning, MCV 76. Iron studies are pending Chest x-ray on admission showed congestive heart failure  versus pneumonia and she is being covered with thank and Zosyn.   Past Medical History:  Diagnosis Date  . Bone cancer ([place])   . Depression   . Essential hypertension   . GERD (gastroesophageal reflux disease)   . Gout   . HLD (hyperlipidemia)   . Type II diabetes mellitus with renal manifestations [facility])     Past Surgical History:  Procedure Laterality Date  . ABOVE KNEE LEG AMPUTATION Right     Prior to Admission medications   Medication Sig Start Date End Date Taking? Authorizing Provider  acetaminophen (TYLENOL) 325 MG tablet Take 650 mg by mouth 2 (two) times daily as needed for mild pain.   Yes [provider]  albuterol (PROVENTIL) (2.5 MG/3ML) 0.083% nebulizer solution Take 2.5 mg by nebulization every 4 (four) hours as needed for wheezing or shortness of breath.   Yes [provider]  allopurinol (ZYLOPRIM) 100 MG tablet Take 100 mg by mouth 2 (two) times daily. 06/24/17  Yes [provider]  amLODipine (NORVASC) 5 MG tablet Take 5 mg by mouth daily. 06/09/17  Yes [provider]  atorvastatin (LIPITOR) 20 MG tablet Take 20 mg by mouth daily. 06/18/17  Yes [provider]  docusate sodium (COLACE) 100 MG capsule Take 100 mg by mouth daily.   Yes [provider]  DULoxetine (CYMBALTA) 30 MG capsule Take 30 mg by mouth daily. 06/09/17  Yes [provider]  Emollient (EUCERIN) lotion Apply 1 mL topically 2 (two) times daily. Apply to body and extremities twice daily   Yes [provider]  epoetin alfa (EPOGEN,PROCRIT) 37628 UNIT/ML injection Inject 20,000 Units  into the skin once a week. On Saturdays   Yes [provider]  ferrous sulfate 325 (65 FE) MG tablet Take 325 mg by mouth 2 (two) times daily with a meal.   Yes [provider]  folic acid (FOLVITE) 1 MG tablet Take 1 mg by mouth daily.   Yes [provider]  furosemide (LASIX) 40 MG tablet Take 40 mg by mouth daily. 06/18/17   Yes [provider]  hydrALAZINE (APRESOLINE) 25 MG tablet Take 25 mg by mouth every 6 (six) hours. 04/28/17  Yes [provider]  insulin NPH Human (HUMULIN N,NOVOLIN N) 100 UNIT/ML injection Inject 2-8 Units into the skin 2 (two) times daily as needed (high blood sugar). SLIDING SCALE BEFORE MEALS-CBG 201-250=2 UNITS, 251-300=4 UNITS, 301-350=6 UNITS, 351-400=8 UNITS, OVER 400 CALL MD, LESS THAN 60=ORANGE JUICE AND RECHECK IN 30 MINUTES    Yes [provider]  multivitamin-iron-minerals-folic acid (CENTRUM) chewable tablet Chew 1 tablet by mouth daily.   Yes [provider]  omeprazole (PRILOSEC) 20 MG capsule Take 20 mg by mouth 2 (two) times daily.   Yes [provider]  Propylene Glycol (SYSTANE BALANCE OP) Place 1 drop into both eyes 2 (two) times daily. FOR DRY EYE SYNDROME   Yes [provider]  SYMBICORT 160-4.5 MCG/ACT inhaler Take 2 puffs by mouth 2 (two) times daily. 06/06/17  Yes [provider]  vitamin C (ASCORBIC ACID) 500 MG tablet Take 500 mg by mouth daily.   Yes [provider]  polyethylene glycol (MIRALAX) packet Take 17 g by mouth daily as needed for mild constipation. Patient not taking: Reported on 11/29/2017 07/05/17   Barton Dubois, MD    Current Facility-Administered Medications  Medication Dose Route Frequency Provider Last Rate Last Dose  . acetaminophen (TYLENOL) tablet 650 mg  650 mg Oral Q6H PRN [name], MD       Or  . acetaminophen (TYLENOL) suppository 650 mg  650 mg Rectal Q6H PRN [name], MD      . albuterol (PROVENTIL) (2.5 MG/3ML) 0.083% nebulizer solution 2.5 mg  2.5 mg Nebulization Q4H PRN [name], MD   2.5 mg at 11/30/17 0203  . allopurinol (ZYLOPRIM) tablet 100 mg  100 mg Oral BID [name], MD   100 mg at 11/30/17 0021  . amLODipine (NORVASC) tablet 5 mg  5 mg Oral Daily [name], MD      . atorvastatin (LIPITOR) tablet 20  mg  20 mg Oral q1800 [name], MD      . Chlorhexidine Gluconate Cloth 2 % PADS 6 each  6 each Topical Q0600 [name], MD   6 each at 11/30/17 [phone]  . DULoxetine (CYMBALTA) DR capsule 30 mg  30 mg Oral Daily [name], MD      . ferrous sulfate tablet 325 mg  325 mg Oral BID WC [name], MD   325 mg at 11/30/17 0854  . folic acid (FOLVITE) tablet 1 mg  1 mg Oral Daily [name], MD      . furosemide (LASIX) 250 mg in dextrose 5 % 250 mL (1 mg/mL) infusion  10 mg/hr Intravenous Continuous Purohit, Konrad Dolores, MD 10 mL/hr at 11/30/17 0853 10 mg/hr at 11/30/17 0853  . hydrALAZINE (APRESOLINE) injection 5 mg  5 mg Intravenous Q4H PRN [name], MD   5 mg at 11/30/17 0724  . hydrALAZINE (APRESOLINE) tablet 25 mg  25 mg Oral Q6H [name], MD   25 mg at 11/30/17 4580  . insulin aspart (novoLOG) injection 0-9 Units  0-9 Units Subcutaneous TID WC [name], MD      . iopamidol (ISOVUE-300) 61 % injection 30 mL  30 mL Oral Once PRN [name], MD      . iopamidol (ISOVUE-300) 61 % injection           . lubriderm seriously sensitive lotion 1 application  1 application Topical BID [name], MD      . metolazone (ZAROXOLYN) tablet 10 mg  10 mg Oral BID Purohit, Konrad Dolores, MD   10 mg at 11/30/17 0854  . mometasone-formoterol (DULERA) 200-5 MCG/ACT inhaler 2 puff  2 puff Inhalation BID [name], MD      . multivitamin with minerals tablet 1 tablet  1 tablet Oral Daily [name], MD      . mupirocin ointment (BACTROBAN) 2 % 1 application  1 application Nasal BID [name], MD      . ondansetron [facility]) tablet 4 mg  4 mg Oral Q6H PRN [name], MD       Or  . ondansetron [facility]) injection 4 mg  4 mg Intravenous Q6H PRN [name], MD      . piperacillin-tazobactam (ZOSYN) IVPB 3.375 g  3.375 g Intravenous Q8H [name], MD      . Derrill Memo ON  12/01/2017] vancomycin (VANCOCIN) 500 mg in sodium chloride 0.9 % 100 mL IVPB  500 mg Intravenous Q36H [name], MD      . vitamin C (ASCORBIC ACID) tablet 500 mg  500 mg Oral Daily [name], MD        Allergies as of 11/29/2017  . (No Known Allergies)    Family History  Problem Relation Age of Onset  . Hypertension Other     Social History   Socioeconomic History  . Marital status: Single    Spouse name: Not on file  . Number of children: Not on file  . Years of education: Not on file  . Highest education level: Not on file  Social Needs  . Financial resource strain: Not on file  . Food insecurity - worry: Not on file  . Food insecurity - inability: Not on file  . Transportation needs - medical: Not on file  . Transportation needs - non-medical: Not on file  Occupational History  . Not on file  Tobacco Use  . Smoking status: Never Smoker  . Smokeless tobacco: Never Used  Substance and Sexual Activity  . Alcohol use: No  . Drug use: No  . Sexual activity: Not on file  Other Topics Concern  . Not on file  Social History Narrative  . Not on file    Review of Systems: Pertinent positive and negative review of systems were noted in the above HPI section.  All other review of systems was otherwise negative.  Physical Exam: Vital signs in last 24 hours: Temp:  [97.9 F (36.6 C)-99 F (37.2 C)] 98.2 F (36.8 C) (03/18 0654) Pulse Rate:  [86-104] 104 (03/18 0800) Resp:  [17-32] 29 (03/18 0800) BP: (146-178)/(53-80) 169/70 (03/18 0800) SpO2:  [92 %-100 %] 100 % (03/18 0800) Weight:  [192 lb 7.4 oz (87.3 kg)] 192 lb 7.4 oz (87.3 kg) (03/17 2325) Last BM Date: 11/30/17 General:   Alert,  Well-developed, well-nourished, Ill appearing Elderly AA female pleasant  and cooperative  Very SOB on 5 l/[place] Head:  Normocephalic and atraumatic. Eyes:  Sclera clear, no icterus.   Conjunctiva pink. Ears:  Normal auditory acuity. Nose:  No deformity, discharge,   or lesions. Mouth:  No deformity or lesions.   Neck:  Supple; no masses or thyromegaly. Lungs:  Crackles bases few  rhonchi. , increased WOB  Heart: ir Regular rate and rhythm; no murmurs, clicks, rubs,  or gallops. Abdomen:  Soft,obese nontender, BS active,nonpalp mass or hsm.   Rectal:  Deferred , heme + on admit Msk:  S/p LE amp . Pulses:  Normal pulses noted. Extremities: S/P BKA Neurologic:  Alert and  oriented x4;  grossly normal neurologically. Skin:  Intact without significant lesions or rashes.. Psych:  Alert and cooperative. Normal mood and affect.  Intake/Output from previous day: 03/17 0701 - 03/18 0700 In: 973.8 [Blood:623.8; IV Piggyback:350] Out: -  Intake/Output this shift: No intake/output data recorded.  Lab Results: Recent Labs    11/29/17 1913  WBC 10.9*  HGB 6.5*  HCT 21.7*  PLT 514*   BMET Recent Labs    11/29/17 1913  NA 140  K 4.6  CL 107  CO2 22  GLUCOSE 94  BUN 55*  CREATININE 2.25*  CALCIUM 8.9   LFT Recent Labs    11/29/17 1913  PROT 6.6  ALBUMIN 3.3*  AST 15  ALT 12*  ALKPHOS 136*  BILITOT 0.3      IMPRESSION:  #35 83 year old African-American female, nursing home resident with multiple serious medical problems admitted last evening with complaints of increased shortness of breath over the past couple of days, nausea and worsening anemia with hemoglobin of 5.9. GI is asked to evaluate for microcytic anemia and heme positive stool. Patient has history of chronic anemia with baseline around 7-8 in setting of stage IV chronic kidney disease. She was also hospitalized in October 2018 with similar situation with hemoglobin of 4.1 at that time and heme positive stool. She did not undergo endoscopic evaluation as she was felt to be very high risk. Etiology of iron deficiency and heme positive stool is not clear, certainly she could have an occult upper or lower GI neoplasm, AVMs, or other source for chronic slow GI blood loss  #2  acute respiratory failure-CHF versus pneumonia versus both Requiring increased oxygen-plan is for transferred to stepdown this morning for BiPAP #3 chronic kidney disease stage IV #4 to onset diabetes mellitus #5 history of hypertension #6 question chronic respiratory failure/COPD/CHF #7 history of bone cancer status post BKA   Plan; Patient is not a candidate for any endoscopic evaluation at this time, she would be extremely high risk for complications with sedation. Would follow-up iron studies, consider IV iron Daily PPI Transfuse as needed to keep her hemoglobin in the 7-8 range We will be available if patient improves to the point that she would be acceptable risk for sedation and endoscopic evaluation. GI will sign off .      Eileen Cobb  11/30/2017, 9:15 AM   I have reviewed the entire case in detail with the above APP and discussed the plan in detail.  Therefore, I agree with the diagnoses recorded above. In addition,  I have personally interviewed and examined the patient and have personally reviewed any abdominal/pelvic CT scan images.  My additional thoughts are as follows:  This woman is currently in respiratory distress and in process of transfer to stepdown unit for BiPAP. Therefore, she is no condition for endoscopic  procedures. I am doubtful that she will return to a state of health that will allow her to tolerate the sedation for endoscopic workup of her chronic anemia and heme pos status.  This appears to be multifactorial anemia of CKD and chronic occult GI blood loss of unknown source.  We will sign off - call as needed.   Nelida Meuse III Pager [phone]  Mon-Fri 8a-5p [phone] after 5p, weekends, holidays

## 2017-11-30 NOTE — Progress Notes (Signed)
  Echocardiogram 2D Echocardiogram has been performed.  Takara Sermons G Joclynn Lumb 11/30/2017, 1:03 PM

## 2017-12-01 LAB — GLUCOSE, CAPILLARY
GLUCOSE-CAPILLARY: 78 mg/dL (ref 65–99)
GLUCOSE-CAPILLARY: 83 mg/dL (ref 65–99)
Glucose-Capillary: 117 mg/dL — ABNORMAL HIGH (ref 65–99)
Glucose-Capillary: 90 mg/dL (ref 65–99)
Glucose-Capillary: 104 mg/dL — ABNORMAL HIGH (ref 65–99)

## 2017-12-01 LAB — BASIC METABOLIC PANEL
BUN: 53 mg/dL — ABNORMAL HIGH (ref 6–20)
CO2: 25 mmol/L (ref 22–32)
Chloride: 103 mmol/L (ref 101–111)
Chloride: 103 mmol/L (ref 101–111)
GFR calc Af Amer: 22 mL/min — ABNORMAL LOW (ref >60)
GFR calc non Af Amer: 20 mL/min — ABNORMAL LOW (ref >60)
GFR calc non Af Amer: 19 mL/min — ABNORMAL LOW (ref >60)
Glucose, Bld: 89 mg/dL (ref 65–99)
Potassium: 4.4 mmol/L (ref 3.5–5.1)
Potassium: 3.7 mmol/L (ref 3.5–5.1)
Sodium: 141 mmol/L (ref 135–145)

## 2017-12-01 LAB — CBC
HCT: 31.7 % — ABNORMAL LOW (ref 36.0–46.0)
Hemoglobin: 9.9 g/dL — ABNORMAL LOW (ref 12.0–15.0)
MCH: 24.7 pg — ABNORMAL LOW (ref 26.0–34.0)
MCHC: 31.2 g/dL (ref 30.0–36.0)
MCV: 79.1 fL (ref 78.0–100.0)
Platelets: 486 10*3/uL — ABNORMAL HIGH (ref 150–400)
RBC: 4.01 MIL/uL (ref 3.87–5.11)
RDW: 20.6 % — ABNORMAL HIGH (ref 11.5–15.5)
WBC: 19.2 K/uL — ABNORMAL HIGH (ref 4.0–10.5)

## 2017-12-01 LAB — TYPE AND SCREEN
ABO/RH(D): O POS
ANTIBODY SCREEN: NEGATIVE
UNIT DIVISION: 0
Unit division: 0

## 2017-12-01 LAB — BASIC METABOLIC PANEL WITH GFR
Anion gap: 17 — ABNORMAL HIGH (ref 5–15)
Anion gap: 13 (ref 5–15)
BUN: 56 mg/dL — ABNORMAL HIGH (ref 6–20)
CO2: 20 mmol/L — ABNORMAL LOW (ref 22–32)
Calcium: 8.9 mg/dL (ref 8.9–10.3)
Calcium: 8.6 mg/dL — ABNORMAL LOW (ref 8.9–10.3)
Creatinine, Ser: 2.18 mg/dL — ABNORMAL HIGH (ref 0.44–1.00)
Creatinine, Ser: 2.32 mg/dL — ABNORMAL HIGH (ref 0.44–1.00)
GFR calc Af Amer: 23 mL/min — ABNORMAL LOW (ref >60)
Glucose, Bld: 95 mg/dL (ref 65–99)
Sodium: 140 mmol/L (ref 135–145)

## 2017-12-01 LAB — BPAM RBC
Blood Product Expiration Date: 201904082359
Blood Product Expiration Date: 201904082359
ISSUE DATE / TIME: 201903172039
ISSUE DATE / TIME: 201903180442
UNIT TYPE AND RH: 5100
Unit Type and Rh: 5100

## 2017-12-01 LAB — MAGNESIUM: Magnesium: 2 mg/dL (ref 1.7–2.4)

## 2017-12-01 MED ORDER — FUROSEMIDE 10 MG/ML IJ SOLN
120.0000 mg | Freq: Two times a day (BID) | INTRAVENOUS | Status: DC
  Administered 2017-12-01 – 2017-12-03 (×5): 120 mg via INTRAVENOUS
  Filled 2017-12-01: qty 12
  Filled 2017-12-01: qty 2
  Filled 2017-12-01 (×2): qty 10
  Filled 2017-12-01: qty 2

## 2017-12-01 NOTE — Progress Notes (Addendum)
Chart reviewed and patient assessment completed. Patient will wake to voice. She answers few questions but drowsy throughout conversation. No family at bedside.   Attempted to call son, Lujean Ebright via telephone. Rings and then disconnects. Not able to leave voicemail. I spoke with emergency contact Willernie who tells me she is the patient's Goddaughter. Ramona and [name] live in [place], [place]. Ramona recently had a death in her family and has been in minimal contact with [name]. She is unsure if he knows his mother is in the hospital. I left my name and number with Ramona to leave message for [name] if she comes in contact with him.   PMT will continue to attempt to reach son for [place] conversation.   NO CHARGE  Ihor Dow, FNP-C Palliative Medicine Team  Phone: [phone] Fax: [phone]

## 2017-12-01 NOTE — Progress Notes (Signed)
PROGRESS NOTE    Eileen Cobb  IPJ:[id number] DOB: 05-21-1935 DOA: 11/29/2017 PCP: Earlyne Iba, MD (  Brief Narrative:  83 year old with past medical history relevant for stage IV chronic kidney disease, chronic anemia due to multiple hypoproliferative causes, hypertension, gout, hyperlipidemia, type 2 diabetes on insulin who comes in from nursing home found to be anemic and short of breath.  Her course has been complicated by acute likely diastolic heart failure exacerbation possibly secondary to high output heart failure vs TACO vs transfusion reaction/TRALI.    Assessment & Plan:   Principal Problem:   Acute on chronic diastolic CHF (congestive heart failure) (HCC) Active Problems:   Essential hypertension   Type II diabetes mellitus with renal manifestations (HCC)   Depression   Stage 4 chronic kidney disease (HCC)   Chronic blood loss anemia   Hypervolemia   #) Acute hypoxic respiratory failure: Multifactorial related to heart failure possibly from fluid overload from blood products, superimposed hospital-acquired pneumonia, possible transfusion reaction including TACO versus TRALI.  Improving with significant diuresis and antibiotics -Continue vancomycin and Zosyn started 11/30/2017 -Discontinue furosemide drip and transition to 120 mg furosemide twice daily, continue metolazone 10 mg twice daily -We will obtain CT chest when more stable -Unable to contact son about goals of care -N.p.o., fluid restriction, weigh daily, strict ins and outs -Palliative care consulted -Echo from 11/30/2017 shows only diastolic dysfunction -Strict ins and outs, weigh daily  #) Acute on chronic anemia: Multifactorial including occult GI losses, anemia chronic disease from CKD -GI signed off all patient is unstable. -Hold Depo injections weekly -Continue ferrous sulfate 325 twice a day with meals  #) Hypertension: -Continue amlodipine 5 mg -Continue hydralazine 25 mg every 6  hours -Hold home oral furosemide 40 mg daily  #) Type 2 diabetes on insulin: -Sliding scale insulin here -N.p.o. while on BiPAP  #) Gout: -Continue allopurinol 100 mg twice a day  #) Hyperlipidemia: -Continue atorvastatin 20 mg daily  #) COPD: -Continue L ABA/ICS  #) Pain: -Continue duloxetine 30 mg daily  #) Bone cancer status post AKA: Suspect likely osteosarcoma based on description however it would be unusual in this age group.  Fluids: Hold IV fluids Electrolyte: Monitor and supplement Nutrition: N.p.o. while on BiPAP  Prophylaxis: SCDs  Disposition: Pending improvement of respiratory status versus discussion of goals of care  DO NOT RESUSCITATE   Consultants:   Palliative care  Procedures: (Don't include imaging studies which can be auto populated. Include things that cannot be auto populated i.e. Echo, Carotid and venous dopplers, Foley, Bipap, HD, tubes/drains, wound vac, central lines etc)  Echo 7/34/1935 stage I diastolic dysfunction  Antimicrobials: (specify start and planned stop date. Auto populated tables are space occupying and do not give end dates)  Vancomycin and Zosyn started 11/30/2017   Subjective: Patient reports that she is feeling better with some improved shortness of breath.  She is quite sleepy.  She denies any chest pain or nausea vomiting at this time. Objective: Vitals:   12/01/17 0600 12/01/17 0800 12/01/17 0840 12/01/17 1001  BP: (!) 133/46   (!) 158/72  Pulse: 97     Resp: (!) 21     Temp:  98.7 F (37.1 C)    TempSrc:  Axillary    SpO2: 99%  95%   Weight:      Height:        Intake/Output Summary (Last 24 hours) at 12/01/2017 1119 Last data filed at 12/01/2017 0600 Gross per 24  hour  Intake 261.17 ml  Output 2400 ml  Net -2138.83 ml   Filed Weights   11/29/17 2325  Weight: 87.3 kg (192 lb 7.4 oz)    Examination:  General exam: In no acute distress Respiratory system: Diminished lung sounds in all extremities,  crackles throughout, no wheezes, scattered rhonchi, moderately increased work of breathing Cardiovascular system: Distant heart sounds, regular Gastrointestinal system: Soft, nontender, nondistended, diminished bowel sounds Central nervous system: Alert, moving all extremities Extremities: 1+ lower extremity edema, status post AKA Skin: No rashes on visible skin Psychiatry: Unable to assess due to patient condition    Data Reviewed: I have personally reviewed following labs and imaging studies  CBC: Recent Labs  Lab 11/29/17 1913 11/30/17 1323 12/01/17 0342  WBC 10.9* 21.2* 19.2*  NEUTROABS 9.1*  --   --   HGB 6.5* 9.6* 9.9*  HCT 21.7* 30.7* 31.7*  MCV 76.4* 79.9 79.1  PLT 514* 504* 355*   Basic Metabolic Panel: Recent Labs  Lab 11/29/17 1913 11/30/17 1323 12/01/17 0342  NA 140 139 140  K 4.6 4.4 4.4  CL 107 104 103  CO2 22 21* 20*  GLUCOSE 94 106* 89  BUN 55* 52* 53*  CREATININE 2.25* 2.17* 2.18*  CALCIUM 8.9 8.9 8.9  MG  --   --  2.0   GFR: Estimated Creatinine Clearance: 20.8 mL/min (A) (by C-G formula based on SCr of 2.18 mg/dL (H)). Liver Function Tests: Recent Labs  Lab 11/29/17 1913  AST 15  ALT 12*  ALKPHOS 136*  BILITOT 0.3  PROT 6.6  ALBUMIN 3.3*   No results for input(s): LIPASE, AMYLASE in the last 168 hours. No results for input(s): AMMONIA in the last 168 hours. Coagulation Profile: No results for input(s): INR, PROTIME in the last 168 hours. Cardiac Enzymes: Recent Labs  Lab 11/30/17 1323  TROPONINI 0.04*   BNP (last 3 results) No results for input(s): PROBNP in the last 8760 hours. HbA1C: No results for input(s): HGBA1C in the last 72 hours. CBG: Recent Labs  Lab 11/30/17 1306 11/30/17 1757 11/30/17 2335 12/01/17 0214 12/01/17 0812  GLUCAP 160* 74 82 78 83   Lipid Profile: No results for input(s): CHOL, HDL, LDLCALC, TRIG, CHOLHDL, LDLDIRECT in the last 72 hours. Thyroid Function Tests: No results for input(s): TSH,  T4TOTAL, FREET4, T3FREE, THYROIDAB in the last 72 hours. Anemia Panel: Recent Labs    11/29/17 1913 11/30/17 1323  VITAMINB12  --  1,437*  FOLATE  --  64.2  FERRITIN  --  20  TIBC  --  295  IRON  --  14*  RETICCTPCT 4.7*  --    Sepsis Labs: Recent Labs  Lab 11/30/17 1323  PROCALCITON 0.77    Recent Results (from the past 240 hour(s))  MRSA PCR Screening     Status: Abnormal   Collection Time: 11/29/17 11:58 PM  Result Value Ref Range Status   MRSA by PCR POSITIVE (A) NEGATIVE Final    Comment:        The GeneXpert MRSA Assay (FDA approved for NASAL specimens only), is one component of a comprehensive MRSA colonization surveillance program. It is not intended to diagnose MRSA infection nor to guide or monitor treatment for MRSA infections. RESULT CALLED TO, READ BACK BY AND VERIFIED WITH: D BROWN 11/30/17 0226 RHOLMES Performed at [facility], [place] [address], [place], [place] [address]          Radiology Studies: Ct Abdomen Pelvis Wo Contrast  Result Date: 11/30/2017 CLINICAL DATA:  Nausea and vomiting. EXAM: CT ABDOMEN AND PELVIS WITHOUT CONTRAST TECHNIQUE: Multidetector CT imaging of the abdomen and pelvis was performed following the standard protocol without IV contrast. COMPARISON:  None. FINDINGS: Lower chest: Small to moderate bilateral pleural effusions and adjacent compressive atelectasis. Mild cardiomegaly. Hepatobiliary: Motion artifact through the liver. No evidence of focal hepatic lesion. Small gallstones within physiologically distended gallbladder. No pericholecystic inflammation. No biliary dilatation. Pancreas: No ductal dilatation or inflammation. Spleen: Normal in size without focal abnormality. Adrenals/Urinary Tract: Mild left adrenal thickening without dominant nodule. Right kidney is not seen. No left hydronephrosis. No urolithiasis. Urinary bladder is partially distended. No bladder wall thickening. Stomach/Bowel: Patient  motion limits bowel assessment. Stomach is nondistended. No small bowel wall thickening, inflammatory change or obstruction. Enteric contrast reaches the distal small bowel. Appendix tentatively visualized and normal. Moderate volume of stool throughout the colon. There is sigmoid colonic tortuosity. No colonic wall thickening or inflammatory change. Vascular/Lymphatic: Aorto bi-iliac atherosclerosis. No enlarged abdominal or pelvic lymph nodes. Reproductive: Enlarged uterus with multiple fibroids, many of which are calcified. No adnexal mass. Other: Generalized body wall edema. No ascites or free air. No intra-abdominal abscess. Tiny fat containing umbilical hernia. Musculoskeletal: Degenerative change in the lumbar spine. There are no acute or suspicious osseous abnormalities. Mild fatty infiltration of paraspinal musculature. IMPRESSION: 1. No acute abnormality in the abdomen/pelvis. 2. Uncomplicated cholelithiasis. 3. Incidental uterine fibroids. 4. Small to moderate bilateral pleural effusions and bibasilar atelectasis. Body wall edema suggest third-spacing. 5.  Aortic Atherosclerosis (ICD10-I70.0). Electronically Signed   By: Jeb Levering M.D.   On: 11/30/2017 03:45   Dg Chest 2 View  Result Date: 11/29/2017 CLINICAL DATA:  Shortness of Breath EXAM: CHEST - 2 VIEW COMPARISON:  None. FINDINGS: There is cardiomegaly with pulmonary venous hypertension. There are small pleural effusions bilaterally. There is patchy airspace opacity in the right mid and lower lung zones. There is mild underlying interstitial pulmonary edema. There is aortic atherosclerosis. No evident adenopathy. 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 IMPRESSION: Findings felt to be indicative of a degree of congestive heart failure. There is airspace consolidation in portions of the right mid lower lung zones. Suspect superimposed pneumonia, although a degree of alveolar edema could present  similarly. Both entities may exist concurrently. There is aortic atherosclerosis. 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 Aortic Atherosclerosis (ICD10-I70.0). Electronically Signed   By: Lowella Grip III M.D.   On: 11/29/2017 20:27   Dg Chest Port 1 View  Result Date: 11/30/2017 CLINICAL DATA:  Shortness of breath. EXAM: PORTABLE CHEST 1 VIEW COMPARISON:  11/29/2017 and CT 11/30/2017 FINDINGS: Lungs are adequately inflated demonstrate moderate right hilar and perihilar opacification without significant change. Mild prominence of the left perihilar markings unchanged. Suggestion of small bilateral pleural effusions as seen on the recent CT scan. Stable cardiomegaly. Remainder the exam is unchanged. IMPRESSION: Stable moderate right hilar perihilar opacification which may be due to asymmetric edema versus infection. Right hilar adenopathy or mass is possible. Recommend chest CT with contrast for further evaluation. Small bilateral effusions and stable cardiomegaly. Electronically Signed   By: Marin Olp M.D.   On: 11/30/2017 08:04        Scheduled Meds: . allopurinol  100 mg Oral BID  . amLODipine  5 mg Oral Daily  . atorvastatin  20 mg Oral q1800  . chlorhexidine  15 mL Mouth Rinse BID  . Chlorhexidine Gluconate Cloth  6 each Topical Q0600  . DULoxetine  30 mg Oral Daily  . ferrous sulfate  325 mg Oral BID WC  . folic acid  1 mg Oral Daily  . hydrALAZINE  25 mg Oral Q6H  . insulin aspart  0-9 Units Subcutaneous TID WC  . lubriderm seriously sensitive  1 application Topical BID  . mouth rinse  15 mL Mouth Rinse q12n4p  . metolazone  10 mg Oral BID  . mometasone-formoterol  2 puff Inhalation BID  . multivitamin with minerals  1 tablet Oral Daily  . mupirocin ointment  1 application Nasal BID  . vitamin C  500 mg Oral Daily   Continuous Infusions: . furosemide    . piperacillin-tazobactam (ZOSYN)  IV Stopped (12/01/17 0900)  . vancomycin Stopped (12/01/17 0004)      LOS: 1 day    Time spent: [place], MD [facility]   If 7PM-7AM, please contact night-coverage [email or web address] Password [email or web address] 12/01/2017, 11:19 AM

## 2017-12-02 ENCOUNTER — Inpatient Hospital Stay (HOSPITAL_COMMUNITY): Payer: Medicare Other

## 2017-12-02 LAB — CBC
HCT: 31.1 % — ABNORMAL LOW (ref 36.0–46.0)
Hemoglobin: 9.9 g/dL — ABNORMAL LOW (ref 12.0–15.0)
MCH: 25 pg — ABNORMAL LOW (ref 26.0–34.0)
MCHC: 31.8 g/dL (ref 30.0–36.0)
MCV: 78.5 fL (ref 78.0–100.0)
Platelets: 372 K/uL (ref 150–400)
RBC: 3.96 MIL/uL (ref 3.87–5.11)
RDW: 21.5 % — ABNORMAL HIGH (ref 11.5–15.5)
WBC: 12.7 K/uL — ABNORMAL HIGH (ref 4.0–10.5)

## 2017-12-02 LAB — COMPREHENSIVE METABOLIC PANEL
ALT: 13 U/L — ABNORMAL LOW (ref 14–54)
AST: 16 U/L (ref 15–41)
Albumin: 2.7 g/dL — ABNORMAL LOW (ref 3.5–5.0)
BUN: 56 mg/dL — ABNORMAL HIGH (ref 6–20)
CO2: 25 mmol/L (ref 22–32)
Chloride: 102 mmol/L (ref 101–111)
Creatinine, Ser: 2.33 mg/dL — ABNORMAL HIGH (ref 0.44–1.00)
GFR calc non Af Amer: 18 mL/min — ABNORMAL LOW (ref >60)
Glucose, Bld: 100 mg/dL — ABNORMAL HIGH (ref 65–99)

## 2017-12-02 LAB — COMPREHENSIVE METABOLIC PANEL WITH GFR
Alkaline Phosphatase: 110 U/L (ref 38–126)
Anion gap: 13 (ref 5–15)
Calcium: 8.4 mg/dL — ABNORMAL LOW (ref 8.9–10.3)
GFR calc Af Amer: 21 mL/min — ABNORMAL LOW (ref >60)
Potassium: 3.4 mmol/L — ABNORMAL LOW (ref 3.5–5.1)
Sodium: 140 mmol/L (ref 135–145)
Total Bilirubin: 0.8 mg/dL (ref 0.3–1.2)
Total Protein: 5.9 g/dL — ABNORMAL LOW (ref 6.5–8.1)

## 2017-12-02 LAB — GLUCOSE, CAPILLARY
GLUCOSE-CAPILLARY: 81 mg/dL (ref 65–99)
Glucose-Capillary: 100 mg/dL — ABNORMAL HIGH (ref 65–99)
Glucose-Capillary: 100 mg/dL — ABNORMAL HIGH (ref 65–99)
Glucose-Capillary: 143 mg/dL — ABNORMAL HIGH (ref 65–99)

## 2017-12-02 LAB — MAGNESIUM: Magnesium: 2.1 mg/dL (ref 1.7–2.4)

## 2017-12-02 MED ORDER — POTASSIUM CHLORIDE CRYS ER 20 MEQ PO TBCR
40.0000 meq | EXTENDED_RELEASE_TABLET | Freq: Once | ORAL | Status: AC
  Administered 2017-12-02: 40 meq via ORAL
  Filled 2017-12-02: qty 2

## 2017-12-02 NOTE — Progress Notes (Signed)
Voicemail left for son again to initiate [place] conversation.   NO CHARGE  Ihor Dow, FNP-C Palliative Medicine Team  Phone: [phone] Fax: [phone]

## 2017-12-02 NOTE — Progress Notes (Signed)
Initial Nutrition Assessment  DOCUMENTATION CODES:   Obesity unspecified  INTERVENTION:  - Will order daily multivitamin with minerals. - Diet advancement as medically feasible. - Continue to encourage PO intakes.  - Will monitor for POC/GOC. - RD will monitor for additional nutrition-related needs.    NUTRITION DIAGNOSIS:   Inadequate oral intake related to acute illness, lethargy/confusion as evidenced by meal completion < 25%.  GOAL:   Patient will meet greater than or equal to 90% of their needs  MONITOR:   PO intake, Diet advancement, Weight trends, Labs  REASON FOR ASSESSMENT:   Low [place]  ASSESSMENT:   83 year old with past medical history relevant for stage IV chronic kidney disease, chronic anemia due to multiple hypoproliferative causes, hypertension, gout, hyperlipidemia, type 2 diabetes on insulin who comes in from nursing home found to be anemic and short of breath.  Her course has been complicated by acute likely diastolic heart failure exacerbation possibly secondary to high output heart failure vs TACO vs transfusion reaction/TRALI.   Pt on FLD with no documented intakes since admission. Pt unable to recall if she had breakfast. No family/visitors present. RN reports that jello and cranberry juice were ordered for pt as this is what she consumed yesterday, but pt has not yet consumed either of these items.  Pt denies abdominal pain or nausea at this time. She was unable to provide any other nutrition-related information.   Pt with R AKA. Very limited weight hx available in the chart shows 13 lb weight loss since 07/02/17. Will continue to monitor weight trends.  Medications reviewed; 1 mg oral folic acid/day, 161 mg IV Lasix BID, sliding scale Novolog, 40 mEq oral KCl x1 dose today, 500 mg ascorbic acid/day.  Labs reviewed; K: 3.4 mmol/L, BUN: 56 mg/dL, creatinine: 2.33 mg/dL, Ca: 8.4 mg/dL, GFR: 21 mL/min.       NUTRITION - FOCUSED PHYSICAL  EXAM:  Completed/assessed; no muscle or fat wasting noted.   Diet Order:  Diet full liquid Room service appropriate? Yes; Fluid consistency: Thin  EDUCATION NEEDS:   No education needs have been identified at this time  Skin:  Skin Assessment: Reviewed RN Assessment  Last BM:  3/19  Height:   Ht Readings from Last 1 Encounters:  11/29/17 5\' 2"  (1.575 m)    Weight:   Wt Readings from Last 1 Encounters:  11/29/17 192 lb 7.4 oz (87.3 kg)    Ideal Body Weight:  50 kg  BMI:  Body mass index is 35.2 kg/m.  Estimated Nutritional Needs:   Kcal:  1570-1830 (18-21 kcal/kg)  Protein:  70-80   Fluid:  >/ 1.6 L/day      Jarome Matin, MS, RD, LDN, [facility] Inpatient Clinical Dietitian Pager # [phone] After hours/weekend pager # [phone]

## 2017-12-02 NOTE — Plan of Care (Signed)
  Elimination: Will not experience complications related to bowel motility 12/02/2017 1443 - Progressing by Dorene Sorrow, RN   Pain Managment: General experience of comfort will improve 12/02/2017 1443 - Progressing by Dorene Sorrow, RN   Skin Integrity: Risk for impaired skin integrity will decrease 12/02/2017 1443 - Progressing by Dorene Sorrow, RN

## 2017-12-02 NOTE — Progress Notes (Signed)
PROGRESS NOTE    Eileen Cobb  YOV:[id number] DOB: 1935/02/27 DOA: 11/29/2017 PCP: Earlyne Iba, MD (  Brief Narrative:  83 year old with past medical history relevant for stage IV chronic kidney disease, chronic anemia due to multiple hypoproliferative causes, hypertension, gout, hyperlipidemia, type 2 diabetes on insulin who comes in from nursing home found to be anemic and short of breath.  Her course has been complicated by acute likely diastolic heart failure exacerbation possibly secondary to high output heart failure vs TACO vs transfusion reaction/TRALI.    Assessment & Plan:   Principal Problem:   Acute on chronic diastolic CHF (congestive heart failure) (HCC) Active Problems:   Essential hypertension   Type II diabetes mellitus with renal manifestations (HCC)   Depression   Stage 4 chronic kidney disease (HCC)   Chronic blood loss anemia   Hypervolemia   #) Acute hypoxic respiratory failure: Likely secondary to fluid overload with superimposed pneumonia.  Repeat chest x-ray continues to show improvement in pulmonary edema however she has worsening bilateral pleural effusions.  It is unclear at this time how much these are contributing to her respiratory status as she continues to have good diuresis and diminished oxygen requirement. -Continue vancomycin and Zosyn started 11/30/2017 -Continue 120 mg furosemide twice daily, continue metolazone 10 mg twice daily -We will obtain CT chest when more stable -Reportedly the son will be coming over the weekend however this writer and palliative care if not been able to get in touch with him -We will consider getting interventional radiology to drain pleural effusions depending on patient's clinical status and goals of care as well as her respiratory status -N.p.o., fluid restriction, weigh daily, strict ins and outs -Palliative care consulted -Echo from 11/30/2017 shows only diastolic dysfunction -Strict ins and outs,  weigh daily  #) Acute on chronic anemia: Multifactorial including occult GI losses, anemia chronic disease from CKD -GI signed off all patient is unstable. -Hold epo injections weekly -Continue ferrous sulfate 325 twice a day with meals  #) Hypertension: -Continue amlodipine 5 mg -Continue hydralazine 25 mg every 6 hours -Hold home oral furosemide 40 mg daily  #) Type 2 diabetes on insulin: -Sliding scale insulin here  #) Gout: -Continue allopurinol 100 mg twice a day  #) Hyperlipidemia: -Continue atorvastatin 20 mg daily  #) COPD: -Continue L ABA/ICS  #) Pain: -Continue duloxetine 30 mg daily  #) Bone cancer status post AKA: Suspect likely osteosarcoma based on description however it would be unusual in this age group.  Fluids: Hold IV fluids Electrolyte: Monitor and supplement Nutrition: N.p.o. while on BiPAP  Prophylaxis: SCDs  Disposition: Pending improvement of respiratory status versus discussion of goals of care  DO NOT RESUSCITATE   Consultants:   Palliative care  Procedures: (Don't include imaging studies which can be auto populated. Include things that cannot be auto populated i.e. Echo, Carotid and venous dopplers, Foley, Bipap, HD, tubes/drains, wound vac, central lines etc)  Echo 7/41/2878 stage I diastolic dysfunction  Antimicrobials: (specify start and planned stop date. Auto populated tables are space occupying and do not give end dates)  Vancomycin and Zosyn started 11/30/2017   Subjective: Patient reports that her shortness of breath is improved.  She otherwise does not want to talk very much.  Objective: Vitals:   12/02/17 0500 12/02/17 0600 12/02/17 0700 12/02/17 0800  BP: (!) 140/57 (!) 124/55 107/71   Pulse: 93 93 97   Resp: 20 (!) 26 (!) 24   Temp:  97.9 F (36.6 C)  TempSrc:    Oral  SpO2: 97% 97% 97%   Weight:      Height:        Intake/Output Summary (Last 24 hours) at 12/02/2017 1037 Last data filed at 12/02/2017  0600 Gross per 24 hour  Intake -  Output 1900 ml  Net -1900 ml   Filed Weights   11/29/17 2325  Weight: 87.3 kg (192 lb 7.4 oz)    Examination:  General exam: In no acute distress Respiratory system: Diminished lung sounds in all extremities, no wheezes, scattered rhonchi, moderately increased work of breathing Cardiovascular system: Distant heart sounds, regular Gastrointestinal system: Soft, nontender, nondistended, diminished bowel sounds Central nervous system: Alert, moving all extremities Extremities: 1+ lower extremity edema, status post AKA Skin: No rashes on visible skin Psychiatry: Unable to assess due to patient condition    Data Reviewed: I have personally reviewed following labs and imaging studies  CBC: Recent Labs  Lab 11/29/17 1913 11/30/17 1323 12/01/17 0342 12/02/17 0326  WBC 10.9* 21.2* 19.2* 12.7*  NEUTROABS 9.1*  --   --   --   HGB 6.5* 9.6* 9.9* 9.9*  HCT 21.7* 30.7* 31.7* 31.1*  MCV 76.4* 79.9 79.1 78.5  PLT 514* 504* 486* 643   Basic Metabolic Panel: Recent Labs  Lab 11/29/17 1913 11/30/17 1323 12/01/17 0342 12/01/17 1631 12/02/17 0326  NA 140 139 140 141 140  K 4.6 4.4 4.4 3.7 3.4*  CL 107 104 103 103 102  CO2 22 21* 20* 25 25  GLUCOSE 94 106* 89 95 100*  BUN 55* 52* 53* 56* 56*  CREATININE 2.25* 2.17* 2.18* 2.32* 2.33*  CALCIUM 8.9 8.9 8.9 8.6* 8.4*  MG  --   --  2.0  --  2.1   GFR: Estimated Creatinine Clearance: 19.4 mL/min (A) (by C-G formula based on SCr of 2.33 mg/dL (H)). Liver Function Tests: Recent Labs  Lab 11/29/17 1913 12/02/17 0326  AST 15 16  ALT 12* 13*  ALKPHOS 136* 110  BILITOT 0.3 0.8  PROT 6.6 5.9*  ALBUMIN 3.3* 2.7*   No results for input(s): LIPASE, AMYLASE in the last 168 hours. No results for input(s): AMMONIA in the last 168 hours. Coagulation Profile: No results for input(s): INR, PROTIME in the last 168 hours. Cardiac Enzymes: Recent Labs  Lab 11/30/17 1323  TROPONINI 0.04*   BNP (last  3 results) No results for input(s): PROBNP in the last 8760 hours. HbA1C: No results for input(s): HGBA1C in the last 72 hours. CBG: Recent Labs  Lab 12/01/17 0812 12/01/17 1202 12/01/17 1704 12/01/17 2135 12/02/17 0824  GLUCAP 83 117* 90 104* 100*   Lipid Profile: No results for input(s): CHOL, HDL, LDLCALC, TRIG, CHOLHDL, LDLDIRECT in the last 72 hours. Thyroid Function Tests: No results for input(s): TSH, T4TOTAL, FREET4, T3FREE, THYROIDAB in the last 72 hours. Anemia Panel: Recent Labs    11/29/17 1913 11/30/17 1323  VITAMINB12  --  1,437*  FOLATE  --  64.2  FERRITIN  --  20  TIBC  --  295  IRON  --  14*  RETICCTPCT 4.7*  --    Sepsis Labs: Recent Labs  Lab 11/30/17 1323  PROCALCITON 0.77    Recent Results (from the past 240 hour(s))  MRSA PCR Screening     Status: Abnormal   Collection Time: 11/29/17 11:58 PM  Result Value Ref Range Status   MRSA by PCR POSITIVE (A) NEGATIVE Final    Comment:  The GeneXpert MRSA Assay (FDA approved for NASAL specimens only), is one component of a comprehensive MRSA colonization surveillance program. It is not intended to diagnose MRSA infection nor to guide or monitor treatment for MRSA infections. RESULT CALLED TO, READ BACK BY AND VERIFIED WITH: D BROWN 11/30/17 0226 RHOLMES Performed at [facility], [place] [address] [place], [place] [address]          Radiology Studies: Dg Chest Port 1 View  Result Date: 12/02/2017 CLINICAL DATA:  Hypoxia. EXAM: PORTABLE CHEST 1 VIEW COMPARISON:  11/30/2017 and 11/29/2017 FINDINGS: Bilateral interstitial pulmonary edema has slightly improved. There is persistent cardiomegaly and pulmonary vascular congestion as well as bilateral effusions, slightly increased. No acute bone abnormality. IMPRESSION: 1. Slight improvement in pulmonary edema. 2. Slight increase in moderate bilateral effusions. Electronically Signed   By: Lorriane Shire M.D.   On: 12/02/2017  08:09        Scheduled Meds: . allopurinol  100 mg Oral BID  . amLODipine  5 mg Oral Daily  . atorvastatin  20 mg Oral q1800  . chlorhexidine  15 mL Mouth Rinse BID  . Chlorhexidine Gluconate Cloth  6 each Topical Q0600  . DULoxetine  30 mg Oral Daily  . ferrous sulfate  325 mg Oral BID WC  . folic acid  1 mg Oral Daily  . hydrALAZINE  25 mg Oral Q6H  . insulin aspart  0-9 Units Subcutaneous TID WC  . lubriderm seriously sensitive  1 application Topical BID  . mouth rinse  15 mL Mouth Rinse q12n4p  . metolazone  10 mg Oral BID  . mometasone-formoterol  2 puff Inhalation BID  . multivitamin with minerals  1 tablet Oral Daily  . mupirocin ointment  1 application Nasal BID  . potassium chloride  40 mEq Oral Once  . vitamin C  500 mg Oral Daily   Continuous Infusions: . furosemide Stopped (12/02/17 0106)  . piperacillin-tazobactam (ZOSYN)  IV 3.375 g (12/02/17 0433)  . vancomycin Stopped (12/01/17 0004)     LOS: 2 days    Time spent: [place], MD [facility]   If 7PM-7AM, please contact night-coverage [email or web address] Password [email or web address] 12/02/2017, 10:37 AM

## 2017-12-03 DIAGNOSIS — I5033 Acute on chronic diastolic (congestive) heart failure: Secondary | ICD-10-CM

## 2017-12-03 LAB — CBC
HCT: 31.2 % — ABNORMAL LOW (ref 36.0–46.0)
Hemoglobin: 9.6 g/dL — ABNORMAL LOW (ref 12.0–15.0)
MCH: 24.7 pg — ABNORMAL LOW (ref 26.0–34.0)
MCHC: 30.8 g/dL (ref 30.0–36.0)
MCV: 80.2 fL (ref 78.0–100.0)
Platelets: 393 K/uL (ref 150–400)
RBC: 3.89 MIL/uL (ref 3.87–5.11)
RDW: 22.1 % — ABNORMAL HIGH (ref 11.5–15.5)
WBC: 13.5 K/uL — ABNORMAL HIGH (ref 4.0–10.5)

## 2017-12-03 LAB — GLUCOSE, CAPILLARY
GLUCOSE-CAPILLARY: 147 mg/dL — AB (ref 65–99)
GLUCOSE-CAPILLARY: 85 mg/dL (ref 65–99)
Glucose-Capillary: 95 mg/dL (ref 65–99)
Glucose-Capillary: 92 mg/dL (ref 65–99)

## 2017-12-03 LAB — BASIC METABOLIC PANEL WITH GFR
Anion gap: 15 (ref 5–15)
GFR calc non Af Amer: 15 mL/min — ABNORMAL LOW (ref >60)
Glucose, Bld: 106 mg/dL — ABNORMAL HIGH (ref 65–99)
Potassium: 3.3 mmol/L — ABNORMAL LOW (ref 3.5–5.1)
Sodium: 141 mmol/L (ref 135–145)

## 2017-12-03 LAB — BASIC METABOLIC PANEL
BUN: 60 mg/dL — ABNORMAL HIGH (ref 6–20)
CO2: 26 mmol/L (ref 22–32)
Calcium: 8.2 mg/dL — ABNORMAL LOW (ref 8.9–10.3)
Chloride: 100 mmol/L — ABNORMAL LOW (ref 101–111)
Creatinine, Ser: 2.7 mg/dL — ABNORMAL HIGH (ref 0.44–1.00)
GFR calc Af Amer: 18 mL/min — ABNORMAL LOW (ref >60)

## 2017-12-03 LAB — MAGNESIUM: Magnesium: 2.1 mg/dL (ref 1.7–2.4)

## 2017-12-03 MED ORDER — METOLAZONE 5 MG PO TABS
10.0000 mg | ORAL_TABLET | Freq: Every day | ORAL | Status: DC
  Administered 2017-12-04 – 2017-12-05 (×2): 10 mg via ORAL
  Filled 2017-12-03 (×2): qty 2

## 2017-12-03 MED ORDER — POTASSIUM CHLORIDE CRYS ER 20 MEQ PO TBCR
40.0000 meq | EXTENDED_RELEASE_TABLET | Freq: Once | ORAL | Status: AC
  Administered 2017-12-04: 40 meq via ORAL
  Filled 2017-12-03: qty 2

## 2017-12-03 MED ORDER — FUROSEMIDE 10 MG/ML IJ SOLN
60.0000 mg | Freq: Two times a day (BID) | INTRAMUSCULAR | Status: DC
  Administered 2017-12-04 – 2017-12-06 (×4): 60 mg via INTRAVENOUS
  Filled 2017-12-03 (×6): qty 6

## 2017-12-03 MED ORDER — PIPERACILLIN-TAZOBACTAM IN DEX 2-0.25 GM/50ML IV SOLN
2.2500 g | Freq: Four times a day (QID) | INTRAVENOUS | Status: DC
  Administered 2017-12-03 – 2017-12-06 (×14): 2.25 g via INTRAVENOUS
  Filled 2017-12-03 (×16): qty 50

## 2017-12-03 NOTE — Progress Notes (Signed)
Pharmacy Antibiotic Note  Eileen Cobb is a 83 y.o. female admitted on 11/29/2017 with pneumonia.  Pharmacy has been consulted for Vancomycin and Zosyn dosing.  Today, 12/03/2017 Day #3 Vanco/zosyn  Renal: Scr trending up  Afebrile  WBC improved from 3/18 and stable overnight (slight increase)  Plan:  Change zosyn to 2.25gm IV q6h as CrCl < 20 ml/min  Continue vancomycin 500mg  IV q36h   Monitor closely as SCr is trending up (also on higher dose of furosemide)  Height: 5\' 2"  (157.5 cm) Weight: 181 lb 7 oz (82.3 kg) IBW/kg (Calculated) : 50.1  Temp (24hrs), Avg:98.5 F (36.9 C), Min:97.7 F (36.5 C), Max:99.9 F (37.7 C)  Recent Labs  Lab 11/29/17 1913 11/30/17 1323 12/01/17 0342 12/01/17 1631 12/02/17 0326 12/03/17 0456  WBC 10.9* 21.2* 19.2*  --  12.7* 13.5*  CREATININE 2.25* 2.17* 2.18* 2.32* 2.33* 2.70*    Estimated Creatinine Clearance: 16.3 mL/min (A) (by C-G formula based on SCr of 2.7 mg/dL (H)).    No Known Allergies  Antimicrobials this admission: Vancomycin 12/03/2017 >> Zosyn 12/03/2017 >>   Dose adjustments this admission:   Microbiology results: 3/17 MRSA PCR: postive  Thank you for allowing pharmacy to be a part of this patient's care.  Doreene Eland, PharmD, BCPS.   Pager: [phone] 12/03/2017 7:44 AM

## 2017-12-03 NOTE — Care Management Important Message (Addendum)
Important Message  Patient Details IM Letter given to Cookie/Case Manager to present to the Patient Name: Eileen Cobb MRN: [id number] Date of Birth: [birth year]   Medicare Important Message Given:  Yes    Kerin Salen 12/03/2017, 10:23 [place] Message  Patient Details  Name: Eileen Cobb MRN: [id number] Date of Birth: May 02, 1935   Medicare Important Message Given:  Yes    Kerin Salen 12/03/2017, 10:23 AM

## 2017-12-03 NOTE — Progress Notes (Signed)
Palliative Medicine RN Note: PMT has made several attempts to reach family, but we have been unable to reach her surrogate (her son). Other contact Ramona will also try to get in touch with him.   Until a decision maker can be reached, PMT cannot address GOC. Please call our office if [name] becomes available.  Marjie Skiff Turner Kunzman, RN, BSN, [facility] Palliative Medicine Team 12/03/2017 9:06 AM Office [phone]

## 2017-12-03 NOTE — Progress Notes (Signed)
Patient Demographics:    Eileen Cobb, is a 83 y.o. female, DOB - 11-17-1934, CZY:[id number]  Admit date - 11/29/2017   Admitting Physician [name], MD  Outpatient Primary MD for the patient is [name], Manya Silvas, MD  LOS - 3   No chief complaint on file.       Subjective:    Eileen Cobb today has no fevers, no emesis,  No chest pain, shortness of breath is better  Assessment  & Plan :    Principal Problem:   Acute on chronic diastolic CHF (congestive heart failure) (HCC) Active Problems:   Essential hypertension   Type II diabetes mellitus with renal manifestations (HCC)   Depression   Stage 4 chronic kidney disease (HCC)   Chronic blood loss anemia   Hypervolemia  Brief Narrative:  83 year old with past medical history relevant for stage IV chronic kidney disease, chronic anemia due to multiple hypoproliferative causes, hypertension, gout, hyperlipidemia, type 2 diabetes on insulin who comes in from nursing home found to be anemic and short of breath.  Her course has been complicated by acute likely diastolic heart failure exacerbation possibly secondary to high output heart failure vs TACO vs transfusion reaction/TRALI.    Assessment & Plan:   1) Acute hypoxic respiratory failure: Likely secondary to fluid overload with superimposed pneumonia.    Clinically and radiologically improved, discontinue vancomycin, continue junction started on 11/30/2017,  decrease Lasix to 60 mg twice daily, change metolazone to 10  mg daily, Consider CT chest when more stable, BiPAP nightly and as needed  2)HFpEF- pt with acute on chronic diastolic dysfunction CHF, see #1 above, overall improving, last known EF over 60%, continue to monitor daily weights in the nose diuretics as above #1  3)) Acute on chronic anemia: Multifactorial including occult GI losses, anemia chronic disease from  CKD -GI signed off as patient is not stable enough to allow for EGD/colonoscopy -Hold epo injections weekly -Continue ferrous sulfate 325 twice a day with meals  #) Hypertension: -Continue amlodipine 5 mg -Continue hydralazine 25 mg every 6 hours   #) Type 2 diabetes on insulin: -Sliding scale insulin here  #) Gout: -Continue allopurinol 100 mg twice a day  #) Hyperlipidemia: -Continue atorvastatin 20 mg daily  #) COPD: -Continue L ABA/ICS  #) Pain: -Continue duloxetine 30 mg daily  #) 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 allopurinol  100 mg Oral BID  . amLODipine  5 mg Oral Daily  . atorvastatin  20 mg Oral q1800  . chlorhexidine  15 mL Mouth Rinse BID  . DULoxetine  30 mg Oral Daily  . ferrous sulfate  325 mg Oral BID WC  . folic acid  1 mg Oral Daily  . [  START ON 12/04/2017] furosemide  60 mg Intravenous BID  . hydrALAZINE  25 mg Oral Q6H  . insulin aspart  0-9 Units Subcutaneous TID WC  . lubriderm seriously sensitive  1 application Topical BID  . mouth rinse  15 mL Mouth Rinse q12n4p  . [START ON 12/04/2017] metolazone  10 mg Oral Daily  . mometasone-formoterol  2 puff Inhalation BID  . multivitamin with minerals  1 tablet Oral Daily  . mupirocin ointment  1 application Nasal BID  . potassium chloride  40 mEq Oral Once  . potassium chloride  40 mEq Oral Once  . vitamin C  500 mg Oral Daily   Continuous Infusions: . piperacillin-tazobactam (ZOSYN)  IV Stopped  (12/03/17 1734)   PRN Meds:.acetaminophen **OR** acetaminophen, albuterol, hydrALAZINE, iopamidol, ondansetron **OR** ondansetron (ZOFRAN) IV    Anti-infectives (From admission, onward)   Start     Dose/Rate Route Frequency Ordered Stop   12/03/17 1200  piperacillin-tazobactam (ZOSYN) IVPB 2.25 g     2.25 g 100 mL/hr over 30 Minutes Intravenous Every 6 hours 12/03/17 0741     12/01/17 1200  vancomycin (VANCOCIN) 500 mg in sodium chloride 0.9 % 100 mL IVPB  Status:  Discontinued     500 mg 100 mL/hr over 60 Minutes Intravenous Every 36 hours 11/30/17 0057 11/30/17 0937   11/30/17 2200  vancomycin (VANCOCIN) 500 mg in sodium chloride 0.9 % 100 mL IVPB  Status:  Discontinued     500 mg 100 mL/hr over 60 Minutes Intravenous Every 36 hours 11/30/17 0937 12/03/17 1836   11/30/17 0800  piperacillin-tazobactam (ZOSYN) IVPB 3.375 g  Status:  Discontinued     3.375 g 12.5 mL/hr over 240 Minutes Intravenous Every 8 hours 11/30/17 0057 12/03/17 0741   11/29/17 2245  vancomycin (VANCOCIN) IVPB 1000 mg/200 mL premix     1,000 mg 200 mL/hr over 60 Minutes Intravenous  Once 11/29/17 2233 11/30/17 0121   11/29/17 2230  piperacillin-tazobactam (ZOSYN) IVPB 3.375 g     3.375 g 100 mL/hr over 30 Minutes Intravenous  Once 11/29/17 2226 11/30/17 0051        Objective:   Vitals:   12/03/17 0414 12/03/17 0928 12/03/17 1254 12/03/17 1315  BP: (!) 133/58 (!) 137/50 (!) 145/49 (!) 133/56  Pulse: 93  (!) 102 95  Resp: 16  (!) 21   Temp: 99.9 F (37.7 C)  99.4 F (37.4 C)   TempSrc: Oral  Oral   SpO2: 95%  94% 94%  Weight:      Height:        Wt Readings from Last 3 Encounters:  12/02/17 82.3 kg (181 lb 7 oz)  07/02/17 81.6 kg (179 lb 14.3 oz)     Intake/Output Summary (Last 24 hours) at 12/03/2017 1842 Last data filed at 12/03/2017 1704 Gross per 24 hour  Intake 874 ml  Output 552 ml  Net 322 ml     Physical Exam  Gen:- Awake Alert,  In no apparent distress  HEENT:- [place].AT, No sclera  icterus Neck-Supple Neck,No JVD,.  Lungs-  CTAB , fair movement CV- S1, S2 normal Abd-  +ve B.Sounds, Abd Soft, No tenderness,    Extremity/Skin:- No  edema,   right AKA Psych-affect is appropriate, oriented x3 Neuro-no new focal deficits, no tremors   Data Review:   Micro Results Recent Results (from the past 240 hour(s))  MRSA PCR Screening     Status: Abnormal   Collection Time: 11/29/17 11:58 PM  Result Value Ref  Range Status   MRSA by PCR POSITIVE (A) NEGATIVE Final    Comment:        The GeneXpert MRSA Assay (FDA approved for NASAL specimens only), is one component of a comprehensive MRSA colonization surveillance program. It is not intended to diagnose MRSA infection nor to guide or monitor treatment for MRSA infections. RESULT CALLED TO, READ BACK BY AND VERIFIED WITH: D BROWN 11/30/17 0226 RHOLMES Performed at [facility], [place] [address], [place],  [address]     Radiology Reports Ct Abdomen Pelvis Wo Contrast  Result Date: 11/30/2017 CLINICAL DATA:  Nausea and vomiting. EXAM: CT ABDOMEN AND PELVIS WITHOUT CONTRAST TECHNIQUE: Multidetector CT imaging of the abdomen and pelvis was performed following the standard protocol without IV contrast. COMPARISON:  None. FINDINGS: Lower chest: Small to moderate bilateral pleural effusions and adjacent compressive atelectasis. Mild cardiomegaly. Hepatobiliary: Motion artifact through the liver. No evidence of focal hepatic lesion. Small gallstones within physiologically distended gallbladder. No pericholecystic inflammation. No biliary dilatation. Pancreas: No ductal dilatation or inflammation. Spleen: Normal in size without focal abnormality. Adrenals/Urinary Tract: Mild left adrenal thickening without dominant nodule. Right kidney is not seen. No left hydronephrosis. No urolithiasis. Urinary bladder is partially distended. No bladder wall thickening. Stomach/Bowel: Patient motion limits bowel assessment.  Stomach is nondistended. No small bowel wall thickening, inflammatory change or obstruction. Enteric contrast reaches the distal small bowel. Appendix tentatively visualized and normal. Moderate volume of stool throughout the colon. There is sigmoid colonic tortuosity. No colonic wall thickening or inflammatory change. Vascular/Lymphatic: Aorto bi-iliac atherosclerosis. No enlarged abdominal or pelvic lymph nodes. Reproductive: Enlarged uterus with multiple fibroids, many of which are calcified. No adnexal mass. Other: Generalized body wall edema. No ascites or free air. No intra-abdominal abscess. Tiny fat containing umbilical hernia. Musculoskeletal: Degenerative change in the lumbar spine. There are no acute or suspicious osseous abnormalities. Mild fatty infiltration of paraspinal musculature. IMPRESSION: 1. No acute abnormality in the abdomen/pelvis. 2. Uncomplicated cholelithiasis. 3. Incidental uterine fibroids. 4. Small to moderate bilateral pleural effusions and bibasilar atelectasis. Body wall edema suggest third-spacing. 5.  Aortic Atherosclerosis (ICD10-I70.0). Electronically Signed   By: Jeb Levering M.D.   On: 11/30/2017 03:45   Dg Chest 2 View  Result Date: 11/29/2017 CLINICAL DATA:  Shortness of Breath EXAM: CHEST - 2 VIEW COMPARISON:  None. FINDINGS: There is cardiomegaly with pulmonary venous hypertension. There are small pleural effusions bilaterally. There is patchy airspace opacity in the right mid and lower lung zones. There is mild underlying interstitial pulmonary edema. There is aortic atherosclerosis. No evident adenopathy. 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 IMPRESSION: Findings felt to be indicative of a degree of congestive heart failure. There is airspace consolidation in portions of the right mid lower lung zones. Suspect superimposed pneumonia, although a degree of alveolar edema could present similarly. Both entities may exist  concurrently. There is aortic atherosclerosis. 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 Aortic Atherosclerosis (ICD10-I70.0). Electronically Signed   By: Lowella Grip III M.D.   On: 11/29/2017 20:27   Dg Chest Port 1 View  Result Date: 12/02/2017 CLINICAL DATA:  Hypoxia. EXAM: PORTABLE CHEST 1 VIEW COMPARISON:  11/30/2017 and 11/29/2017 FINDINGS: Bilateral interstitial pulmonary edema has slightly improved. There is persistent cardiomegaly and pulmonary vascular congestion as well as bilateral effusions, slightly increased. No acute bone abnormality. IMPRESSION: 1. Slight improvement in pulmonary edema. 2. Slight increase in moderate bilateral effusions. Electronically Signed   By:  Lorriane Shire M.D.   On: 12/02/2017 08:09   Dg Chest Port 1 View  Result Date: 11/30/2017 CLINICAL DATA:  Shortness of breath. EXAM: PORTABLE CHEST 1 VIEW COMPARISON:  11/29/2017 and CT 11/30/2017 FINDINGS: Lungs are adequately inflated demonstrate moderate right hilar and perihilar opacification without significant change. Mild prominence of the left perihilar markings unchanged. Suggestion of small bilateral pleural effusions as seen on the recent CT scan. Stable cardiomegaly. Remainder the exam is unchanged. IMPRESSION: Stable moderate right hilar perihilar opacification which may be due to asymmetric edema versus infection. Right hilar adenopathy or mass is possible. Recommend chest CT with contrast for further evaluation. Small bilateral effusions and stable cardiomegaly. Electronically Signed   By: Marin Olp M.D.   On: 11/30/2017 08:04     CBC Recent Labs  Lab 11/29/17 1913 11/30/17 1323 12/01/17 0342 12/02/17 0326 12/03/17 0456  WBC 10.9* 21.2* 19.2* 12.7* 13.5*  HGB 6.5* 9.6* 9.9* 9.9* 9.6*  HCT 21.7* 30.7* 31.7* 31.1* 31.2*  PLT 514* 504* 486* 372 393  MCV 76.4* 79.9 79.1 78.5 80.2  MCH 22.9* 25.0* 24.7* 25.0* 24.7*  MCHC 30.0 31.3 31.2 31.8 30.8  RDW 19.7* 19.8* 20.6* 21.5* 22.1*   LYMPHSABS 1.0  --   --   --   --   MONOABS 0.7  --   --   --   --   EOSABS 0.1  --   --   --   --   BASOSABS 0.0  --   --   --   --     Chemistries  Recent Labs  Lab 11/29/17 1913 11/30/17 1323 12/01/17 0342 12/01/17 1631 12/02/17 0326 12/03/17 0456  NA 140 139 140 141 140 141  K 4.6 4.4 4.4 3.7 3.4* 3.3*  CL 107 104 103 103 102 100*  CO2 22 21* 20* 25 25 26   GLUCOSE 94 106* 89 95 100* 106*  BUN 55* 52* 53* 56* 56* 60*  CREATININE 2.25* 2.17* 2.18* 2.32* 2.33* 2.70*  CALCIUM 8.9 8.9 8.9 8.6* 8.4* 8.2*  MG  --   --  2.0  --  2.1 2.1  AST 15  --   --   --  16  --   ALT 12*  --   --   --  13*  --   ALKPHOS 136*  --   --   --  110  --   BILITOT 0.3  --   --   --  0.8  --    ------------------------------------------------------------------------------------------------------------------ No results for input(s): CHOL, HDL, LDLCALC, TRIG, CHOLHDL, LDLDIRECT in the last 72 hours.  Lab Results  Component Value Date   HGBA1C 4.8 07/03/2017   ------------------------------------------------------------------------------------------------------------------ No results for input(s): TSH, T4TOTAL, T3FREE, THYROIDAB in the last 72 hours.  Invalid input(s): FREET3 ------------------------------------------------------------------------------------------------------------------ No results for input(s): VITAMINB12, FOLATE, FERRITIN, TIBC, IRON, RETICCTPCT in the last 72 hours.  Coagulation profile No results for input(s): INR, PROTIME in the last 168 hours.  No results for input(s): DDIMER in the last 72 hours.  Cardiac Enzymes Recent Labs  Lab 11/30/17 1323  TROPONINI 0.04*   ------------------------------------------------------------------------------------------------------------------    Component Value Date/Time   BNP 855.4 (H) 11/30/2017 1323     Roxan Hockey M.D on 12/03/2017 at 6:42 PM  Between 7am to 7pm - Pager - [phone]  After 7pm go to  [email or web address] - password [facility]  [facility] -  Office  [phone]   Voice Recognition Viviann Spare dictation system was used to create this  note, attempts have been made to correct errors. Please contact the author with questions and/or clarifications.

## 2017-12-03 NOTE — Progress Notes (Signed)
LCSW acknowledges consult.   Patient is from [name].   LCSW has attempted to reach patients son, [name] and facility for collateral with no success.   LCSW will continue to follow for disposition.   Eileen Coy [place]
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
## 2017-12-04 LAB — BASIC METABOLIC PANEL
Anion gap: 15 (ref 5–15)
BUN: 64 mg/dL — AB (ref 6–20)
CHLORIDE: 99 mmol/L — AB (ref 101–111)
CO2: 24 mmol/L (ref 22–32)
CREATININE: 2.84 mg/dL — AB (ref 0.44–1.00)
Calcium: 8.3 mg/dL — ABNORMAL LOW (ref 8.9–10.3)
GFR calc Af Amer: 17 mL/min — ABNORMAL LOW (ref >60)
GFR calc non Af Amer: 15 mL/min — ABNORMAL LOW (ref >60)
Glucose, Bld: 89 mg/dL (ref 65–99)
Potassium: 3.5 mmol/L (ref 3.5–5.1)
SODIUM: 138 mmol/L (ref 135–145)

## 2017-12-04 LAB — GLUCOSE, CAPILLARY
GLUCOSE-CAPILLARY: 85 mg/dL (ref 65–99)
GLUCOSE-CAPILLARY: 106 mg/dL — AB (ref 65–99)
Glucose-Capillary: 101 mg/dL — ABNORMAL HIGH (ref 65–99)
Glucose-Capillary: 93 mg/dL (ref 65–99)

## 2017-12-04 MED ORDER — POTASSIUM CHLORIDE CRYS ER 20 MEQ PO TBCR
40.0000 meq | EXTENDED_RELEASE_TABLET | Freq: Once | ORAL | Status: AC
  Administered 2017-12-04: 40 meq via ORAL
  Filled 2017-12-04: qty 2

## 2017-12-04 NOTE — Progress Notes (Signed)
Patient Demographics:    Eileen Cobb, is a 82 y.o. female, DOB - Jul 13, 1935, ELF:[id number]  Admit date - 11/29/2017   Admitting Physician [name], MD  Outpatient Primary MD for the patient is [name], Manya Silvas, MD  LOS - 4  No chief complaint on file.      Subjective:    Eileen Cobb today has no fevers, no emesis,  No chest pain, appetite is poor,   Assessment  & Plan :    Principal Problem:   Acute on chronic diastolic CHF (congestive heart failure) (HCC) Active Problems:   Essential hypertension   Type II diabetes mellitus with renal manifestations (HCC)   Depression   Stage 4 chronic kidney disease (HCC)   Chronic blood loss anemia   Hypervolemia  Brief Narrative:  83 year old with past medical history relevant for stage IV chronic kidney disease, chronic anemia due to multiple hypoproliferative causes, hypertension, gout, hyperlipidemia, type 2 diabetes on insulin who comes in from nursing home found to be anemic and short of breath.  Her course has been complicated by acute likely diastolic heart failure exacerbation possibly secondary to high output heart failure vs TACO vs transfusion reaction/TRALI.  11/29/2017 patient was transfused 2 units of packed cells, there was concern about volume overload, she was subsequently diuresed resulting in elevation of her creatinine, review of records suggest CKD stage III at baseline with baseline creatinine 1.5-1.6 range   Assessment & Plan:  1) Acute Hypoxic Respiratory Failure: Dyspnea and hypoxia has improved, is presumed to be  secondary to fluid overload with superimposed pneumonia.   Vancomycin has been discontinued, continue Zosyn started on 11/30/2017,  C/n  Lasix 60 mg twice daily, c/n metolazone 10  mg daily, Consider CT chest when more stable, BiPAP nightly and as needed  2)HFpEF- pt with acute on chronic diastolic  dysfunction CHF, see #1 above, overall improving, last known EF over 60%, continue to monitor daily weights in the nose diuretics as above #1  3)) Acute on chronic anemia: Multifactorial including occult GI losses, anemia of chronic disease from CKD, GI signed off as patient is not stable enough to allow for EGD/colonoscopy, weekly EPO injections are currently on hold, continue iron sulfate tab.  Workup this admission revealed iron deficiency with serum iron of 14 saturation of 5%, folate and B12 are not low  4) Hypertension:- c/n Amlodipine 5 mg, Continue hydralazine 25 mg every 6 hours  5) Type 2 diabetes on insulin:-Last known A1c 4.8, use Novolog/Humalog Sliding scale insulin with Accu-Cheks/Fingersticks as ordered  6) AKI on CKD III-creatinine continues to trend up despite adjusting diuretics downward and stopping vancomycin, creatinine up to 2.84, on 11/29/2017 creatinine was 2.2, 5 months ago creatinine was in the 1.5-1.6 range.  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 allopurinol  100 mg Oral BID  . amLODipine  5 mg Oral Daily  . atorvastatin  20 mg Oral q1800  . chlorhexidine  15 mL Mouth  Rinse BID  . DULoxetine  30 mg Oral Daily  . ferrous sulfate  325 mg Oral BID WC  . folic acid  1 mg Oral Daily  . furosemide  60 mg Intravenous BID  . hydrALAZINE  25 mg Oral Q6H  . insulin aspart  0-9 Units Subcutaneous TID WC  . lubriderm seriously sensitive  1 application Topical BID  . mouth rinse  15 mL Mouth Rinse q12n4p  . metolazone  10 mg Oral Daily  . mometasone-formoterol  2 puff Inhalation BID  . multivitamin with minerals  1 tablet Oral Daily  . mupirocin ointment  1 application Nasal BID  . vitamin C  500 mg Oral Daily   Continuous Infusions: . piperacillin-tazobactam (ZOSYN)  IV 2.25 g (12/04/17 1814)   PRN Meds:.acetaminophen **OR** acetaminophen, albuterol, hydrALAZINE, iopamidol, ondansetron **OR** ondansetron (ZOFRAN) IV    Anti-infectives (From admission, onward)   Start     Dose/Rate Route Frequency Ordered Stop   12/03/17 1200  piperacillin-tazobactam (ZOSYN) IVPB 2.25 g     2.25 g 100 mL/hr over 30 Minutes Intravenous Every 6 hours 12/03/17 0741     12/01/17 1200  vancomycin (VANCOCIN) 500 mg in sodium chloride 0.9 % 100 mL IVPB  Status:  Discontinued     500 mg 100 mL/hr over 60 Minutes Intravenous Every 36 hours 11/30/17 0057 11/30/17 0937   11/30/17 2200  vancomycin (VANCOCIN) 500 mg in sodium chloride 0.9 % 100 mL IVPB  Status:  Discontinued     500 mg 100 mL/hr over 60 Minutes Intravenous Every 36 hours 11/30/17 0937 12/03/17 1836   11/30/17 0800  piperacillin-tazobactam (ZOSYN) IVPB 3.375 g  Status:  Discontinued     3.375 g 12.5 mL/hr over 240 Minutes Intravenous Every 8 hours 11/30/17 0057 12/03/17 0741   11/29/17 2245  vancomycin (VANCOCIN) IVPB 1000 mg/200 mL premix     1,000 mg 200 mL/hr over 60 Minutes Intravenous  Once 11/29/17 2233 11/30/17 0121   11/29/17 2230  piperacillin-tazobactam (ZOSYN) IVPB 3.375 g     3.375 g 100 mL/hr over 30 Minutes Intravenous  Once 11/29/17 2226 11/30/17 0051        Objective:   Vitals:   12/04/17 0002  12/04/17 0417 12/04/17 1129 12/04/17 1816  BP: (!) 155/66 (!) 138/55  133/60  Pulse: 84 97  90  Resp:  18  (!) 24  Temp:  99.1 F (37.3 C)  98.6 F (37 C)  TempSrc:  Oral  Axillary  SpO2:  96% 95% 100%  Weight:      Height:        Wt Readings from Last 3 Encounters:  12/02/17 82.3 kg (181 lb 7 oz)  07/02/17 81.6 kg (179 lb 14.3 oz)     Intake/Output Summary (Last 24 hours) at 12/04/2017 2019 Last data filed at 12/04/2017 1800 Gross per 24 hour  Intake 660 ml  Output -  Net 660 ml     Physical Exam  Gen:- Awake Alert,  In  no apparent distress  HEENT:- [place].AT, No sclera icterus Nose- [place] 2 L/min Neck-Supple Neck,No JVD,.  Lungs-  CTAB , fair movement CV- S1, S2 normal Abd-  +ve B.Sounds, Abd Soft, No tenderness,    Extremity/Skin:- Lt LE with intact pulses, right AKA Psych-affect is appropriate, oriented x3 Neuro-no new focal deficits, no tremors   Data Review:   Micro Results Recent Results (from the past 240 hour(s))  MRSA PCR Screening     Status: Abnormal   Collection Time: 11/29/17 11:58 PM  Result Value Ref Range Status   MRSA by PCR POSITIVE (A) NEGATIVE Final    Comment:        The GeneXpert MRSA Assay (FDA approved for NASAL specimens only), is one component of a comprehensive MRSA colonization surveillance program. It is not intended to diagnose MRSA infection nor to guide or monitor treatment for MRSA infections. RESULT CALLED TO, READ BACK BY AND VERIFIED WITH: D BROWN 11/30/17 0226 RHOLMES Performed at [facility], [address] [place], [place] [address]     Radiology Reports Ct Abdomen Pelvis Wo Contrast  Result Date: 11/30/2017 CLINICAL DATA:  Nausea and vomiting. EXAM: CT ABDOMEN AND PELVIS WITHOUT CONTRAST TECHNIQUE: Multidetector CT imaging of the abdomen and pelvis was performed following the standard protocol without IV contrast. COMPARISON:  None. FINDINGS: Lower chest: Small to moderate bilateral pleural  effusions and adjacent compressive atelectasis. Mild cardiomegaly. Hepatobiliary: Motion artifact through the liver. No evidence of focal hepatic lesion. Small gallstones within physiologically distended gallbladder. No pericholecystic inflammation. No biliary dilatation. Pancreas: No ductal dilatation or inflammation. Spleen: Normal in size without focal abnormality. Adrenals/Urinary Tract: Mild left adrenal thickening without dominant nodule. Right kidney is not seen. No left hydronephrosis. No urolithiasis. Urinary bladder is partially distended. No bladder wall thickening. Stomach/Bowel: Patient motion limits bowel assessment. Stomach is nondistended. No small bowel wall thickening, inflammatory change or obstruction. Enteric contrast reaches the distal small bowel. Appendix tentatively visualized and normal. Moderate volume of stool throughout the colon. There is sigmoid colonic tortuosity. No colonic wall thickening or inflammatory change. Vascular/Lymphatic: Aorto bi-iliac atherosclerosis. No enlarged abdominal or pelvic lymph nodes. Reproductive: Enlarged uterus with multiple fibroids, many of which are calcified. No adnexal mass. Other: Generalized body wall edema. No ascites or free air. No intra-abdominal abscess. Tiny fat containing umbilical hernia. Musculoskeletal: Degenerative change in the lumbar spine. There are no acute or suspicious osseous abnormalities. Mild fatty infiltration of paraspinal musculature. IMPRESSION: 1. No acute abnormality in the abdomen/pelvis. 2. Uncomplicated cholelithiasis. 3. Incidental uterine fibroids. 4. Small to moderate bilateral pleural effusions and bibasilar atelectasis. Body wall edema suggest third-spacing. 5.  Aortic Atherosclerosis (ICD10-I70.0). Electronically Signed   By: Jeb Levering M.D.   On: 11/30/2017 03:45   Dg Chest 2 View  Result Date: 11/29/2017 CLINICAL DATA:  Shortness of Breath EXAM: CHEST - 2 VIEW COMPARISON:  None. FINDINGS: There is  cardiomegaly with pulmonary venous hypertension. There are small pleural effusions bilaterally. There is patchy airspace opacity in the right mid and lower lung zones. There is mild underlying interstitial pulmonary edema. There is aortic atherosclerosis. No evident adenopathy. 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 IMPRESSION: Findings felt to be indicative of a degree of congestive heart failure. There is airspace consolidation in portions of the right mid lower lung zones. Suspect superimposed pneumonia, although a degree of alveolar edema could present similarly. Both entities may exist concurrently. There is aortic atherosclerosis. 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 Aortic Atherosclerosis (ICD10-I70.0). Electronically Signed   By: Lowella Grip III M.D.   On: 11/29/2017 20:27   Dg Chest Port 1 View  Result Date: 12/02/2017 CLINICAL DATA:  Hypoxia. EXAM: PORTABLE CHEST 1 VIEW COMPARISON:  11/30/2017 and 11/29/2017 FINDINGS: Bilateral interstitial pulmonary edema has slightly improved. There is persistent cardiomegaly and pulmonary vascular congestion as well as bilateral effusions, slightly increased. No acute bone abnormality. IMPRESSION: 1. Slight improvement in pulmonary edema. 2. Slight increase in moderate bilateral effusions. Electronically Signed   By: Lorriane Shire M.D.   On: 12/02/2017 08:09   Dg Chest Port 1 View  Result Date: 11/30/2017 CLINICAL DATA:  Shortness of breath. EXAM: PORTABLE CHEST 1 VIEW COMPARISON:  11/29/2017 and CT 11/30/2017 FINDINGS: Lungs are adequately inflated demonstrate moderate right hilar and perihilar opacification without significant change. Mild prominence of the left perihilar markings unchanged. Suggestion of small bilateral pleural effusions as seen on the recent CT scan. Stable cardiomegaly. Remainder the exam is unchanged. IMPRESSION: Stable moderate right hilar perihilar opacification which may  be due to asymmetric edema versus infection. Right hilar adenopathy or mass is possible. Recommend chest CT with contrast for further evaluation. Small bilateral effusions and stable cardiomegaly. Electronically Signed   By: Marin Olp M.D.   On: 11/30/2017 08:04     CBC Recent Labs  Lab 11/29/17 1913 11/30/17 1323 12/01/17 0342 12/02/17 0326 12/03/17 0456  WBC 10.9* 21.2* 19.2* 12.7* 13.5*  HGB 6.5* 9.6* 9.9* 9.9* 9.6*  HCT 21.7* 30.7* 31.7* 31.1* 31.2*  PLT 514* 504* 486* 372 393  MCV 76.4* 79.9 79.1 78.5 80.2  MCH 22.9* 25.0* 24.7* 25.0* 24.7*  MCHC 30.0 31.3 31.2 31.8 30.8  RDW 19.7* 19.8* 20.6* 21.5* 22.1*  LYMPHSABS 1.0  --   --   --   --   MONOABS 0.7  --   --   --   --   EOSABS 0.1  --   --   --   --   BASOSABS 0.0  --   --   --   --     Chemistries  Recent Labs  Lab 11/29/17 1913  12/01/17 0342 12/01/17 1631 12/02/17 0326 12/03/17 0456 12/04/17 0510  NA 140   < > 140 141 140 141 138  K 4.6   < > 4.4 3.7 3.4* 3.3* 3.5  CL 107   < > 103 103 102 100* 99*  CO2 22   < > 20* 25 25 26 24   GLUCOSE 94   < > 89 95 100* 106* 89  BUN 55*   < > 53* 56* 56* 60* 64*  CREATININE 2.25*   < > 2.18* 2.32* 2.33* 2.70* 2.84*  CALCIUM 8.9   < > 8.9 8.6* 8.4* 8.2* 8.3*  MG  --   --  2.0  --  2.1 2.1  --   AST 15  --   --   --  16  --   --   ALT 12*  --   --   --  13*  --   --   ALKPHOS 136*  --   --   --  110  --   --   BILITOT 0.3  --   --   --  0.8  --   --    < > = values in this interval not displayed.   ------------------------------------------------------------------------------------------------------------------ No results for input(s): CHOL, HDL, LDLCALC, TRIG, CHOLHDL, LDLDIRECT in the last 72 hours.  Lab Results  Component Value Date   HGBA1C 4.8 07/03/2017   ------------------------------------------------------------------------------------------------------------------ No results for input(s): TSH, T4TOTAL, T3FREE, THYROIDAB in the last 72  hours.  Invalid input(s): FREET3 ------------------------------------------------------------------------------------------------------------------ No results for input(s): VITAMINB12, FOLATE, FERRITIN, TIBC, IRON, RETICCTPCT in the last 72 hours.  Coagulation profile No results for input(s): INR, PROTIME in the last 168 hours.  No results for input(s): DDIMER in the last 72 hours.  Cardiac Enzymes Recent Labs  Lab 11/30/17 1323  TROPONINI 0.04*   ------------------------------------------------------------------------------------------------------------------    Component Value Date/Time   BNP 855.4 (H) 11/30/2017 1323     Roxan Hockey M.D on 12/04/2017 at 8:19 PM  Between 7am to 7pm - Pager - [phone]  After 7pm go to [email or web address] - password [email or web address]  [facility] -  Office  [phone]   Voice Recognition Viviann Spare dictation system was used to create this note, attempts have been made to correct errors. Please contact the author with questions and/or clarifications.

## 2017-12-05 ENCOUNTER — Encounter (HOSPITAL_COMMUNITY): Payer: Self-pay

## 2017-12-05 LAB — GLUCOSE, CAPILLARY
GLUCOSE-CAPILLARY: 166 mg/dL — AB (ref 65–99)
Glucose-Capillary: 94 mg/dL (ref 65–99)
Glucose-Capillary: 97 mg/dL (ref 65–99)

## 2017-12-05 LAB — BASIC METABOLIC PANEL
ANION GAP: 14 (ref 5–15)
BUN: 70 mg/dL — ABNORMAL HIGH (ref 6–20)
CO2: 26 mmol/L (ref 22–32)
CREATININE: 3.05 mg/dL — AB (ref 0.44–1.00)
Calcium: 8.5 mg/dL — ABNORMAL LOW (ref 8.9–10.3)
Chloride: 99 mmol/L — ABNORMAL LOW (ref 101–111)
GFR, EST AFRICAN AMERICAN: 15 mL/min — AB (ref >60)
GFR, EST NON AFRICAN AMERICAN: 13 mL/min — AB (ref >60)
GLUCOSE: 97 mg/dL (ref 65–99)
Potassium: 3.7 mmol/L (ref 3.5–5.1)
Sodium: 139 mmol/L (ref 135–145)

## 2017-12-05 NOTE — NC FL2 (Signed)
[place] LEVEL OF CARE SCREENING TOOL     IDENTIFICATION  Patient Name: Eileen Cobb Birthdate: 1935/03/18 Sex: female Admission Date (Current Location): 11/29/2017  [facility] and [place] Number:  Herbalist and Address:  [facility],  [place] [address] [place]      Provider Number: [id number]  Attending Physician Name and Address:  Roxan Hockey, MD  Relative Name and Phone Number:  Sylvana Bonk, [phone]    Current Level of Care: Hospital Recommended Level of Care: [place] Prior Approval Number:    Date Approved/Denied:   PASRR Number:    Discharge Plan: SNF    Current Diagnoses: Patient Active Problem List   Diagnosis Date Noted  . Acute on chronic diastolic CHF (congestive heart failure) ([place]) 11/30/2017  . Chronic blood loss anemia 11/30/2017  . Hypervolemia 11/30/2017  . Anemia of chronic disease   . Stage 4 chronic kidney disease ([place])   . Normocytic anemia 07/02/2017  . GIB (gastrointestinal bleeding) 07/02/2017  . Essential hypertension   . HLD (hyperlipidemia)   . GERD (gastroesophageal reflux disease)   . Type II diabetes mellitus with renal manifestations ([place])   . Gout   . Depression     Orientation RESPIRATION BLADDER Height & Weight     Self, Situation, Place  (2L) External catheter Weight: 181 lb 7 oz (82.3 kg) Height:  5\' 2"  (157.5 cm)  BEHAVIORAL SYMPTOMS/MOOD NEUROLOGICAL BOWEL NUTRITION STATUS      Continent Diet(See dc summary)  AMBULATORY STATUS COMMUNICATION OF NEEDS Skin   Total Care Verbally Normal                       Personal Care Assistance Level of Assistance  Bathing, Feeding, Dressing Bathing Assistance: Maximum assistance Feeding assistance: Independent Dressing Assistance: Maximum assistance     Functional Limitations Info  Sight, Hearing, Speech Sight Info: Impaired Hearing Info: Adequate Speech Info: Adequate    SPECIAL CARE FACTORS  FREQUENCY        PT Frequency: 2x wk OT Frequency: 2x wk            Contractures Contractures Info: Not present    Additional Factors Info  Code Status, Allergies Code Status Info: DNR Allergies Info: NKA           Current Medications (12/05/2017):  This is the current hospital active medication list Current Facility-Administered Medications  Medication Dose Route Frequency Provider Last Rate Last Dose  . acetaminophen (TYLENOL) tablet 650 mg  650 mg Oral Q6H PRN [name], MD       Or  . acetaminophen (TYLENOL) suppository 650 mg  650 mg Rectal Q6H PRN [name], MD      . albuterol (PROVENTIL) (2.5 MG/3ML) 0.083% nebulizer solution 2.5 mg  2.5 mg Nebulization Q4H PRN [name], MD   2.5 mg at 11/30/17 0203  . allopurinol (ZYLOPRIM) tablet 100 mg  100 mg Oral BID [name], MD   100 mg at 12/05/17 0858  . amLODipine (NORVASC) tablet 5 mg  5 mg Oral Daily [name], MD   5 mg at 12/05/17 0858  . atorvastatin (LIPITOR) tablet 20 mg  20 mg Oral q1800 [name], MD   20 mg at 12/04/17 1815  . chlorhexidine (PERIDEX) 0.12 % solution 15 mL  15 mL Mouth Rinse BID Purohit, Shrey C, MD   15 mL at 12/05/17 1051  . DULoxetine (CYMBALTA)  DR capsule 30 mg  30 mg Oral Daily [name], MD   30 mg at 12/05/17 0857  . ferrous sulfate tablet 325 mg  325 mg Oral BID WC [name], MD   325 mg at 12/05/17 0858  . folic acid (FOLVITE) tablet 1 mg  1 mg Oral Daily [name], MD   1 mg at 12/05/17 0858  . furosemide (LASIX) injection 60 mg  60 mg Intravenous BID Roxan Hockey, MD   60 mg at 12/05/17 0859  . hydrALAZINE (APRESOLINE) injection 10 mg  10 mg Intravenous Q4H PRN Gardiner Barefoot, NP   10 mg at 12/01/17 0559  . hydrALAZINE (APRESOLINE) tablet 25 mg  25 mg Oral Q6H [name], MD   25 mg at 12/05/17 1059  . insulin aspart (novoLOG) injection 0-9 Units  0-9 Units Subcutaneous  TID WC [name], MD   1 Units at 12/03/17 [phone]  . iopamidol (ISOVUE-300) 61 % injection 30 mL  30 mL Oral Once PRN [name], MD      . lubriderm seriously sensitive lotion 1 application  1 application Topical BID [name], MD   1 application at 93/71/69 1051  . MEDLINE mouth rinse  15 mL Mouth Rinse q12n4p Purohit, Shrey C, MD   15 mL at 12/05/17 1051  . metolazone (ZAROXOLYN) tablet 10 mg  10 mg Oral Daily Denton Brick, Courage, MD   10 mg at 12/05/17 0858  . mometasone-formoterol (DULERA) 200-5 MCG/ACT inhaler 2 puff  2 puff Inhalation BID [name], MD   2 puff at 12/05/17 0804  . multivitamin with minerals tablet 1 tablet  1 tablet Oral Daily [name], MD   1 tablet at 12/05/17 [phone]  . ondansetron (ZOFRAN) tablet 4 mg  4 mg Oral Q6H PRN [name], MD       Or  . ondansetron [facility]) injection 4 mg  4 mg Intravenous Q6H PRN [name], MD      . piperacillin-tazobactam (ZOSYN) IVPB 2.25 g  2.25 g Intravenous Q6H Berton Mount, RPH 100 mL/hr at 12/05/17 1059 2.25 g at 12/05/17 1059  . vitamin C (ASCORBIC ACID) tablet 500 mg  500 mg Oral Daily [name], MD   500 mg at 12/05/17 3810     Discharge Medications: Please see discharge summary for a list of discharge medications.  Relevant Imaging Results:  Relevant Lab Results:   Additional Information ssn: [id number]  Wende Neighbors, LCSW

## 2017-12-05 NOTE — Progress Notes (Signed)
Patient Demographics:    Eileen Cobb, is a 83 y.o. female, DOB - 1935/05/23, DVV:[id number]  Admit date - 11/29/2017   Admitting Physician [name], MD  Outpatient Primary MD for the patient is [name], Manya Silvas, MD  LOS - 5  No chief complaint on file.      Subjective:    Eileen Cobb today has no fevers, no emesis,  No chest pain, appetite is poor, patient remains incontinent, denies abdominal pain denies flank pain, tearful that her son has not come yet from [place].  Patient was tearful when told that we need to talk to a nephrologist about her worsening renal function, patient insisted that she did not want to talk to nephrologist if they were going to recommend hemodialysis.  Advised patient to keep an open mind and talk to nephrologist and then make decisions with her son once nephrologist makes  recommendations  Assessment  & Plan :    Principal Problem:   Acute on chronic diastolic CHF (congestive heart failure) ([place]) Active Problems:   Essential hypertension   Type II diabetes mellitus with renal manifestations (HCC)   Depression   Stage 4 chronic kidney disease ([place])   Chronic blood loss anemia   Hypervolemia  Brief Narrative:  83 year old with past medical history relevant for stage IV chronic kidney disease, chronic anemia due to multiple hypoproliferative causes, hypertension, gout, hyperlipidemia, type 2 diabetes on insulin who comes in from nursing home found to be anemic and short of breath.  Her course has been complicated by acute likely diastolic heart failure exacerbation possibly secondary to high output heart failure vs TACO vs transfusion reaction/TRALI.  11/29/2017 patient was transfused 2 units of packed cells, there was concern about volume overload, she was subsequently diuresed resulting in elevation of her creatinine, review of records suggest CKD stage  III at baseline with baseline creatinine 1.5-1.6 range   Assessment & Plan:  1) Acute Hypoxic Respiratory Failure: Continues to improve a respiratory standpoint , continue to wean off oxygen as able , respiratory failure was presumed to be  secondary to fluid overload in the setting of transfusion of packed cells on 11/29/2017 as well as suspected superimposed pneumonia.   Vancomycin has been discontinued, continue Zosyn started on 11/30/2017,  C/n  Lasix 60 mg twice daily,  STOP metolazone on 12/05/17,  C/n  BiPAP nightly and as needed  2)HFpEF- pt with acute on chronic diastolic dysfunction CHF, see #1 above, overall improving, last known EF over 60%, continue to monitor daily weights, input and output is not accurate as patient is incontinent, currently on Lasix 60 mg twice a day, will stop metolazone on 12/05/2017  3)) Acute on chronic anemia: Multifactorial including occult GI losses, anemia of chronic disease from CKD, GI signed off as patient is not stable enough to allow for EGD/colonoscopy, weekly EPO injections are currently on hold, continue iron sulfate tab.  Workup this admission revealed iron deficiency with serum iron of 14 saturation of 5%, folate and B12 are not low.  Hemoglobin has been stable above 9, recheck on 12/06/17  4)Hypertension:-Stable, c/n Amlodipine 5 mg, Continue hydralazine 25 mg every 6 hours  5)Type 2 diabetes on insulin:-Last known A1c 4.8, use Novolog/Humalog Sliding scale insulin  with Accu-Cheks/Fingersticks as ordered  6) AKI on CKD III-renal function is worsening, creatinine is up to 3.0,  creatinine was 2.2 on 11/29/2017 ,  5 months ago creatinine was in the 1.5-1.6 range.  Review of records suggest at baseline patient has CKD stage III, stop metolazone on 12/05/2017, continue Lasix 60 mg twice daily, discussed with Dr. Jonnie Finner from Nephrology service, official consult pending. Patient was tearful when told that we need to talk to a nephrologist about her  worsening renal function, patient insisted that she did not want to talk to nephrologist if they were going to recommend hemodialysis.  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 allopurinol  100 mg Oral BID  . amLODipine  5 mg Oral Daily  . atorvastatin  20 mg Oral q1800  . chlorhexidine  15 mL Mouth Rinse BID  . DULoxetine  30 mg Oral Daily  . ferrous sulfate  325 mg Oral BID WC  . folic acid  1 mg Oral Daily  . furosemide  60 mg Intravenous BID  . hydrALAZINE  25 mg Oral Q6H  . insulin aspart  0-9 Units Subcutaneous TID WC  . lubriderm seriously sensitive  1 application Topical BID  . mouth rinse  15 mL Mouth Rinse q12n4p  . metolazone  10 mg Oral Daily  . mometasone-formoterol  2 puff Inhalation BID  . multivitamin with minerals  1 tablet Oral Daily  . vitamin C  500 mg Oral Daily   Continuous Infusions: . piperacillin-tazobactam (ZOSYN)  IV Stopped (12/05/17 1129)   PRN Meds:.acetaminophen **OR** acetaminophen, albuterol, hydrALAZINE, iopamidol, ondansetron **OR** ondansetron (ZOFRAN)  IV    Anti-infectives (From admission, onward)   Start     Dose/Rate Route Frequency Ordered Stop   12/03/17 1200  piperacillin-tazobactam (ZOSYN) IVPB 2.25 g     2.25 g 100 mL/hr over 30 Minutes Intravenous Every 6 hours 12/03/17 0741     12/01/17 1200  vancomycin (VANCOCIN) 500 mg in sodium chloride 0.9 % 100 mL IVPB  Status:  Discontinued     500 mg 100 mL/hr over 60 Minutes Intravenous Every 36 hours 11/30/17 0057 11/30/17 0937   11/30/17 2200  vancomycin (VANCOCIN) 500 mg in sodium chloride 0.9 % 100 mL IVPB  Status:  Discontinued     500 mg 100 mL/hr over 60 Minutes Intravenous Every 36 hours 11/30/17 0937 12/03/17 1836   11/30/17 0800  piperacillin-tazobactam (ZOSYN) IVPB 3.375 g  Status:  Discontinued     3.375 g 12.5 mL/hr over 240 Minutes Intravenous Every 8 hours 11/30/17 0057 12/03/17 0741   11/29/17 2245  vancomycin (VANCOCIN) IVPB 1000 mg/200 mL premix  1,000 mg 200 mL/hr over 60 Minutes Intravenous  Once 11/29/17 2233 11/30/17 0121   11/29/17 2230  piperacillin-tazobactam (ZOSYN) IVPB 3.375 g     3.375 g 100 mL/hr over 30 Minutes Intravenous  Once 11/29/17 2226 11/30/17 0051        Objective:   Vitals:   12/05/17 0502 12/05/17 0805 12/05/17 1059 12/05/17 1346  BP: (!) 130/51  (!) 137/50 128/60  Pulse: 93   91  Resp: 20   20  Temp: 98.1 F (36.7 C)   99.3 F (37.4 C)  TempSrc: Oral   Oral  SpO2: 96% 92%  95%  Weight:      Height:        Wt Readings from Last 3 Encounters:  12/02/17 82.3 kg (181 lb 7 oz)  07/02/17 81.6 kg (179 lb 14.3 oz)     Intake/Output Summary (Last 24 hours) at 12/05/2017 1702 Last data filed at 12/05/2017 1346 Gross per 24 hour  Intake 860 ml  Output -  Net 860 ml     Physical Exam  Gen:- Awake Alert,  Talking HEENT:- [place].AT, No sclera icterus Nose- [place] 2 L/min Neck-Supple Neck,No JVD,.  Lungs-  CTAB , fair movement CV- S1, S2 normal Abd-  +ve B.Sounds, Abd Soft, No tenderness,    Extremity/Skin:- Lt LE with intact  pulses, right AKA Psych- tearful that her son has not come yet from [place], oriented x3 Neuro-no new focal deficits, no tremors   Data Review:   Micro Results Recent Results (from the past 240 hour(s))  MRSA PCR Screening     Status: Abnormal   Collection Time: 11/29/17 11:58 PM  Result Value Ref Range Status   MRSA by PCR POSITIVE (A) NEGATIVE Final    Comment:        The GeneXpert MRSA Assay (FDA approved for NASAL specimens only), is one component of a comprehensive MRSA colonization surveillance program. It is not intended to diagnose MRSA infection nor to guide or monitor treatment for MRSA infections. RESULT CALLED TO, READ BACK BY AND VERIFIED WITH: D BROWN 11/30/17 0226 RHOLMES Performed at [facility], [place] [address] [place],  [address]     Radiology Reports Ct Abdomen Pelvis Wo Contrast  Result Date: 11/30/2017 CLINICAL DATA:  Nausea and vomiting. EXAM: CT ABDOMEN AND PELVIS WITHOUT CONTRAST TECHNIQUE: Multidetector CT imaging of the abdomen and pelvis was performed following the standard protocol without IV contrast. COMPARISON:  None. FINDINGS: Lower chest: Small to moderate bilateral pleural effusions and adjacent compressive atelectasis. Mild cardiomegaly. Hepatobiliary: Motion artifact through the liver. No evidence of focal hepatic lesion. Small gallstones within physiologically distended gallbladder. No pericholecystic inflammation. No biliary dilatation. Pancreas: No ductal dilatation or inflammation. Spleen: Normal in size without focal abnormality. Adrenals/Urinary Tract: Mild left adrenal thickening without dominant nodule. Right kidney is not seen. No left hydronephrosis. No urolithiasis. Urinary bladder is partially distended. No bladder wall thickening. Stomach/Bowel: Patient motion limits bowel assessment. Stomach is nondistended. No small bowel wall thickening, inflammatory change or obstruction. Enteric contrast reaches the  distal small bowel. Appendix tentatively visualized and normal. Moderate volume of stool throughout the colon. There is sigmoid colonic tortuosity. No colonic wall thickening or inflammatory change. Vascular/Lymphatic: Aorto bi-iliac atherosclerosis. No enlarged abdominal or pelvic lymph nodes. Reproductive: Enlarged uterus with multiple fibroids, many of which are calcified. No adnexal mass. Other: Generalized body wall edema. No ascites or free air. No intra-abdominal abscess. Tiny fat containing umbilical hernia. Musculoskeletal: Degenerative change in the  lumbar spine. There are no acute or suspicious osseous abnormalities. Mild fatty infiltration of paraspinal musculature. IMPRESSION: 1. No acute abnormality in the abdomen/pelvis. 2. Uncomplicated cholelithiasis. 3. Incidental uterine fibroids. 4. Small to moderate bilateral pleural effusions and bibasilar atelectasis. Body wall edema suggest third-spacing. 5.  Aortic Atherosclerosis (ICD10-I70.0). Electronically Signed   By: Jeb Levering M.D.   On: 11/30/2017 03:45   Dg Chest 2 View  Result Date: 11/29/2017 CLINICAL DATA:  Shortness of Breath EXAM: CHEST - 2 VIEW COMPARISON:  None. FINDINGS: There is cardiomegaly with pulmonary venous hypertension. There are small pleural effusions bilaterally. There is patchy airspace opacity in the right mid and lower lung zones. There is mild underlying interstitial pulmonary edema. There is aortic atherosclerosis. No evident adenopathy. 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 IMPRESSION: Findings felt to be indicative of a degree of congestive heart failure. There is airspace consolidation in portions of the right mid lower lung zones. Suspect superimposed pneumonia, although a degree of alveolar edema could present similarly. Both entities may exist concurrently. There is aortic atherosclerosis. 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 Aortic Atherosclerosis  (ICD10-I70.0). Electronically Signed   By: Lowella Grip III M.D.   On: 11/29/2017 20:27   Dg Chest Port 1 View  Result Date: 12/02/2017 CLINICAL DATA:  Hypoxia. EXAM: PORTABLE CHEST 1 VIEW COMPARISON:  11/30/2017 and 11/29/2017 FINDINGS: Bilateral interstitial pulmonary edema has slightly improved. There is persistent cardiomegaly and pulmonary vascular congestion as well as bilateral effusions, slightly increased. No acute bone abnormality. IMPRESSION: 1. Slight improvement in pulmonary edema. 2. Slight increase in moderate bilateral effusions. Electronically Signed   By: Lorriane Shire M.D.   On: 12/02/2017 08:09   Dg Chest Port 1 View  Result Date: 11/30/2017 CLINICAL DATA:  Shortness of breath. EXAM: PORTABLE CHEST 1 VIEW COMPARISON:  11/29/2017 and CT 11/30/2017 FINDINGS: Lungs are adequately inflated demonstrate moderate right hilar and perihilar opacification without significant change. Mild prominence of the left perihilar markings unchanged. Suggestion of small bilateral pleural effusions as seen on the recent CT scan. Stable cardiomegaly. Remainder the exam is unchanged. IMPRESSION: Stable moderate right hilar perihilar opacification which may be due to asymmetric edema versus infection. Right hilar adenopathy or mass is possible. Recommend chest CT with contrast for further evaluation. Small bilateral effusions and stable cardiomegaly. Electronically Signed   By: Marin Olp M.D.   On: 11/30/2017 08:04     CBC Recent Labs  Lab 11/29/17 1913 11/30/17 1323 12/01/17 0342 12/02/17 0326 12/03/17 0456  WBC 10.9* 21.2* 19.2* 12.7* 13.5*  HGB 6.5* 9.6* 9.9* 9.9* 9.6*  HCT 21.7* 30.7* 31.7* 31.1* 31.2*  PLT 514* 504* 486* 372 393  MCV 76.4* 79.9 79.1 78.5 80.2  MCH 22.9* 25.0* 24.7* 25.0* 24.7*  MCHC 30.0 31.3 31.2 31.8 30.8  RDW 19.7* 19.8* 20.6* 21.5* 22.1*  LYMPHSABS 1.0  --   --   --   --   MONOABS 0.7  --   --   --   --   EOSABS 0.1  --   --   --   --   BASOSABS 0.0  --    --   --   --     Chemistries  Recent Labs  Lab 11/29/17 1913  12/01/17 0342 12/01/17 1631 12/02/17 0326 12/03/17 0456 12/04/17 0510 12/05/17 0432  NA 140   < > 140 141 140 141 138 139  K 4.6   < > 4.4 3.7  3.4* 3.3* 3.5 3.7  CL 107   < > 103 103 102 100* 99* 99*  CO2 22   < > 20* 25 25 26 24 26   GLUCOSE 94   < > 89 95 100* 106* 89 97  BUN 55*   < > 53* 56* 56* 60* 64* 70*  CREATININE 2.25*   < > 2.18* 2.32* 2.33* 2.70* 2.84* 3.05*  CALCIUM 8.9   < > 8.9 8.6* 8.4* 8.2* 8.3* 8.5*  MG  --   --  2.0  --  2.1 2.1  --   --   AST 15  --   --   --  16  --   --   --   ALT 12*  --   --   --  13*  --   --   --   ALKPHOS 136*  --   --   --  110  --   --   --   BILITOT 0.3  --   --   --  0.8  --   --   --    < > = values in this interval not displayed.   ------------------------------------------------------------------------------------------------------------------ No results for input(s): CHOL, HDL, LDLCALC, TRIG, CHOLHDL, LDLDIRECT in the last 72 hours.  Lab Results  Component Value Date   HGBA1C 4.8 07/03/2017   ------------------------------------------------------------------------------------------------------------------ No results for input(s): TSH, T4TOTAL, T3FREE, THYROIDAB in the last 72 hours.  Invalid input(s): FREET3 ------------------------------------------------------------------------------------------------------------------ No results for input(s): VITAMINB12, FOLATE, FERRITIN, TIBC, IRON, RETICCTPCT in the last 72 hours.  Coagulation profile No results for input(s): INR, PROTIME in the last 168 hours.  No results for input(s): DDIMER in the last 72 hours.  Cardiac Enzymes Recent Labs  Lab 11/30/17 1323  TROPONINI 0.04*   ------------------------------------------------------------------------------------------------------------------    Component Value Date/Time   BNP 855.4 (H) 11/30/2017 1323     Roxan Hockey M.D on 12/05/2017 at 5:02  PM  Between 7am to 7pm - Pager - [phone]  After 7pm go to [email or web address] - password [email or web address]  [facility] -  Office  [phone]   Voice Recognition Viviann Spare dictation system was used to create this note, attempts have been made to correct errors. Please contact the author with questions and/or clarifications.

## 2017-12-06 ENCOUNTER — Inpatient Hospital Stay (HOSPITAL_COMMUNITY): Payer: Medicare Other

## 2017-12-06 LAB — GLUCOSE, CAPILLARY
GLUCOSE-CAPILLARY: 124 mg/dL — AB (ref 65–99)
GLUCOSE-CAPILLARY: 108 mg/dL — AB (ref 65–99)
Glucose-Capillary: 108 mg/dL — ABNORMAL HIGH (ref 65–99)
Glucose-Capillary: 105 mg/dL — ABNORMAL HIGH (ref 65–99)

## 2017-12-06 NOTE — Progress Notes (Signed)
Pharmacy Antibiotic Note  Eileen Cobb is a 83 y.o. female admitted on 11/29/2017 with pneumonia.  Pharmacy has been consulted for Vancomycin and Zosyn dosing.  Today, 12/06/2017 Day #6  Zosyn  Renal: Scr still elevated   Afebrile  WBC improved from 3/18 and stable overnight (slight increase)  Plan:  Continue zosyn to 2.25gm IV q6h as CrCl < 20 ml/min   Height: 5\' 2"  (157.5 cm) Weight: 180 lb 5.4 oz (81.8 kg) IBW/kg (Calculated) : 50.1  Temp (24hrs), Avg:99.3 F (37.4 C), Min:98.1 F (36.7 C), Max:100.6 F (38.1 C)  Recent Labs  Lab 11/29/17 1913 11/30/17 1323 12/01/17 0342 12/01/17 1631 12/02/17 0326 12/03/17 0456 12/04/17 0510 12/05/17 0432  WBC 10.9* 21.2* 19.2*  --  12.7* 13.5*  --   --   CREATININE 2.25* 2.17* 2.18* 2.32* 2.33* 2.70* 2.84* 3.05*    Estimated Creatinine Clearance: 14.3 mL/min (A) (by C-G formula based on SCr of 3.05 mg/dL (H)).    No Known Allergies  Antimicrobials this admission: 3/18 vanco >>3/21 3/18 zosyn >>  Dose adjustments this admission:  3/21 ZEI to 2.25gm q6h for CrCL < 20   Microbiology results: 3/17 MRSA PCR: postive  Thank you for allowing pharmacy to be a part of this patient's care.   Royetta Asal, PharmD, BCPS Pager [phone] 12/06/2017 4:09 PM

## 2017-12-06 NOTE — Progress Notes (Signed)
Patient Demographics:    Eileen Cobb, is a 83 y.o. female, DOB - 1934/09/19, OMV:[id number]  Admit date - 11/29/2017   Admitting Physician [name], MD  Outpatient Primary MD for the patient is Eileen Cobb, Eileen Silvas, MD  LOS - 6  No chief complaint on file.      Subjective:    [name] today has no fevers, no emesis,   appetite is poor, patient remains incontinent, denies abdominal pain denies flank pain, patient refused to talk to nephrologist Dr. Henrietta Dine earlier today, family conference at bedside with patient, his son Eileen Cobb and palliative care physician Dr. Rowe Pavy  Assessment  & Plan :    Principal Problem:   Acute on chronic diastolic CHF (congestive heart failure) ([name]) Active Problems:   Essential hypertension   Type II diabetes mellitus with renal manifestations (H[facility])   Depression   Stage 4 chronic kidney disease ([name])   Chronic blood loss anemia   Hypervolemia  Brief Narrative:  83 year old with past medical history relevant for stage IV chronic kidney disease, chronic anemia due to multiple hypoproliferative causes, hypertension, gout, hyperlipidemia, type 2 diabetes on insulin who comes in from nursing home found to be anemic and short of breath.  Her course has been complicated by acute likely diastolic heart failure exacerbation possibly secondary to high output heart failure vs TACO vs transfusion reaction/TRALI.  11/29/2017 patient was transfused 2 units of packed cells, there was concern about volume overload, she was subsequently diuresed resulting in elevation of her creatinine, review of records suggest CKD stage III at baseline with baseline creatinine 1.5-1.6 range   Assessment & Plan:  1) Acute Hypoxic Respiratory Failure: At baseline prior to admission patient was on 2 L of oxygen via nasal cannula continue to require 2-3 L at this time,, clinically and  radiologically respiratory failure was presumed to be  secondary to fluid overload in the setting of transfusion of packed cells on 11/29/2017 ,   Vancomycin has been discontinued, may stop Zosyn on 12/06/17 as patient has had 7 days of therapy started on 11/30/2017 , as per nephrologist Dr Jonnie Finner may STOP   Lasix  ,  STOP metolazone on 12/05/17,  C/n  BiPAP nightly and as needed  2)HFpEF- pt with acute on chronic diastolic dysfunction CHF, see #1 above, clinically and radiologically CHF findings persist, with persistent hypoxia, last known EF over 60%, nput and output is not accurate as patient is incontinent, as per nephrologist Dr Jonnie Finner may STOP   Lasix  , will stop metolazone on 12/05/2017  3)) Acute on chronic anemia: Multifactorial including occult GI losses, anemia of chronic disease from CKD, GI signed off as patient is not stable enough to allow for EGD/colonoscopy, weekly EPO injections are currently on hold, continue iron sulfate tab.  Workup this admission revealed iron deficiency with serum iron of 14 saturation of 5%, folate and B12 are not low.  Hemoglobin has been stable above 9, recheck on 12/06/17.  In the future should patient require transfusion this would be very tricky as she is at risk for volume overload, and if she goes into volume overload from transfusion of packed cells, it will  be very difficult to diurese due to poor kidney function  4)Hypertension:-Stable, c/n Amlodipine 5 mg, Continue hydralazine 25 mg every 6 hours  5)Type 2 diabetes on insulin:-Last known A1c 4.8, use Novolog/Humalog Sliding scale insulin with Accu-Cheks/Fingersticks as ordered  6) AKI on CKD III-IV- renal function is worsening, creatinine is up to 3.0,  creatinine was 2.2 on 11/29/2017 ,  5 months ago creatinine was in the 1.5-1.6 range.  Review of records suggest at baseline patient has CKD stage III to IV, stop metolazone on 12/05/2017, continue Lasix 60 mg twice daily, discussed with Dr. Jonnie Finner from  Nephrology service, official consult not done as Patient refused to talk to Dr Jonnie Finner when he went to her room on 12/06/2017, patient is leaning towards de-escalating treatment rather than getting further consultations with specialist physicians . 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   11)Social/Ethics-palliative care conference with patient's son Eileen Cobb from [place] with palliative care team and myself on 12/06/2017 and to delineate goals of care,  Patient has chronic anemia requiring transfusion from time to time however with transfusion she is at risk for volume overload due to diastolic dysfunction CHF, it is difficult to diurese patient due to poor renal function , I hypoxia persist, clinically and on chest x-ray she continues to have pulmonary edema, nephrologist advises against aggressive loop diuretics at this time due to kidney concerns however CHF concerns persist .  After palliative care conference today patient remains DNR DNI, MOST form filled out: DNR DNI no PEG, judicious use of IVF and Abx.      12)Gout:-Continue allopurinol 100 mg twice a day  Prophylaxis: SCDs  Disposition: SNF with hospice on discharge.  Consultants:   Palliative care   Procedures:    Echo 6/38/7564 stage I diastolic dysfunction  Antimicrobials   Vancomycin and Zosyn started 11/30/2017 Stopped vancomycin on 12/03/2017 Zosyn stopped 12/06/17   Lab Results  Component Value Date   PLT 393 12/03/2017    Inpatient Medications  Scheduled Meds: . allopurinol  100 mg Oral BID  . amLODipine  5 mg Oral Daily  . atorvastatin  20 mg Oral q1800  . chlorhexidine  15 mL Mouth Rinse BID  . DULoxetine   30 mg Oral Daily  . ferrous sulfate  325 mg Oral BID WC  . folic acid  1 mg Oral Daily  . hydrALAZINE  25 mg Oral Q6H  . insulin aspart  0-9 Units Subcutaneous TID WC  . lubriderm seriously sensitive  1 application Topical BID  . mouth rinse  15 mL Mouth Rinse q12n4p  . mometasone-formoterol  2 puff Inhalation BID  . multivitamin with minerals  1 tablet Oral Daily  . vitamin C  500 mg Oral Daily   Continuous Infusions: . piperacillin-tazobactam (ZOSYN)  IV Stopped (12/06/17 1748)   PRN Meds:.acetaminophen **OR** acetaminophen, albuterol, hydrALAZINE, iopamidol, ondansetron **OR** ondansetron (ZOFRAN) IV    Anti-infectives (From admission, onward)   Start     Dose/Rate Route Frequency Ordered Stop   12/03/17 1200  piperacillin-tazobactam (ZOSYN) IVPB 2.25 g     2.25 g 100 mL/hr over 30 Minutes Intravenous Every 6 hours 12/03/17 0741     12/01/17 1200  vancomycin (VANCOCIN) 500 mg in sodium chloride 0.9 % 100 mL IVPB  Status:  Discontinued  500 mg 100 mL/hr over 60 Minutes Intravenous Every 36 hours 11/30/17 0057 11/30/17 0937   11/30/17 2200  vancomycin (VANCOCIN) 500 mg in sodium chloride 0.9 % 100 mL IVPB  Status:  Discontinued     500 mg 100 mL/hr over 60 Minutes Intravenous Every 36 hours 11/30/17 0937 12/03/17 1836   11/30/17 0800  piperacillin-tazobactam (ZOSYN) IVPB 3.375 g  Status:  Discontinued     3.375 g 12.5 mL/hr over 240 Minutes Intravenous Every 8 hours 11/30/17 0057 12/03/17 0741   11/29/17 2245  vancomycin (VANCOCIN) IVPB 1000 mg/200 mL premix     1,000 mg 200 mL/hr over 60 Minutes Intravenous  Once 11/29/17 2233 11/30/17 0121   11/29/17 2230  piperacillin-tazobactam (ZOSYN) IVPB 3.375 g     3.375 g 100 mL/hr over 30 Minutes Intravenous  Once 11/29/17 2226 11/30/17 0051        Objective:   Vitals:   12/06/17 0530 12/06/17 0923 12/06/17 1332 12/06/17 1537  BP: (!) 157/66  (!) 146/60   Pulse: 87  91   Resp:   16   Temp: 98.1 F (36.7 C)  (!) 100.6 F  (38.1 C) 99.5 F (37.5 C)  TempSrc: Oral  Oral Oral  SpO2: 94% 95% 95%   Weight:      Height:        Wt Readings from Last 3 Encounters:  12/06/17 81.8 kg (180 lb 5.4 oz)  07/02/17 81.6 kg (179 lb 14.3 oz)     Intake/Output Summary (Last 24 hours) at 12/06/2017 1835 Last data filed at 12/06/2017 1400 Gross per 24 hour  Intake 200 ml  Output -  Net 200 ml     Physical Exam  Gen:- Awake Alert,  Talking HEENT:- [place].AT, No sclera icterus Nose- [place] 2 L/min Neck-Supple Neck,No JVD,.  Lungs-  CTAB , fair movement CV- S1, S2 normal Abd-  +ve B.Sounds, Abd Soft, No tenderness,    Extremity/Skin:- Lt LE with intact pulses, right AKA Psych-flat affect, oriented x3 Neuro-no new focal deficits, no tremors   Data Review:   Micro Results Recent Results (from the past 240 hour(s))  MRSA PCR Screening     Status: Abnormal   Collection Time: 11/29/17 11:58 PM  Result Value Ref Range Status   MRSA by PCR POSITIVE (A) NEGATIVE Final    Comment:        The GeneXpert MRSA Assay (FDA approved for NASAL specimens only), is one component of a comprehensive MRSA colonization surveillance program. It is not intended to diagnose MRSA infection nor to guide or monitor treatment for MRSA infections. RESULT CALLED TO, READ BACK BY AND VERIFIED WITH: D BROWN 11/30/17 0226 RHOLMES Performed at [facility], [place] [address], [place], [place] [address]     Radiology Reports Ct Abdomen Pelvis Wo Contrast  Result Date: 11/30/2017 CLINICAL DATA:  Nausea and vomiting. EXAM: CT ABDOMEN AND PELVIS WITHOUT CONTRAST TECHNIQUE: Multidetector CT imaging of the abdomen and pelvis was performed following the standard protocol without IV contrast. COMPARISON:  None. FINDINGS: Lower chest: Small to moderate bilateral pleural effusions and adjacent compressive atelectasis. Mild cardiomegaly. Hepatobiliary: Motion artifact through the liver. No evidence of focal hepatic lesion. Small  gallstones within physiologically distended gallbladder. No pericholecystic inflammation. No biliary dilatation. Pancreas: No ductal dilatation or inflammation. Spleen: Normal in size without focal abnormality. Adrenals/Urinary Tract: Mild left adrenal thickening without dominant nodule. Right kidney is not seen. No left hydronephrosis. No urolithiasis. Urinary bladder is partially distended. No bladder  wall thickening. Stomach/Bowel: Patient motion limits bowel assessment. Stomach is nondistended. No small bowel wall thickening, inflammatory change or obstruction. Enteric contrast reaches the distal small bowel. Appendix tentatively visualized and normal. Moderate volume of stool throughout the colon. There is sigmoid colonic tortuosity. No colonic wall thickening or inflammatory change. Vascular/Lymphatic: Aorto bi-iliac atherosclerosis. No enlarged abdominal or pelvic lymph nodes. Reproductive: Enlarged uterus with multiple fibroids, many of which are calcified. No adnexal mass. Other: Generalized body wall edema. No ascites or free air. No intra-abdominal abscess. Tiny fat containing umbilical hernia. Musculoskeletal: Degenerative change in the lumbar spine. There are no acute or suspicious osseous abnormalities. Mild fatty infiltration of paraspinal musculature. IMPRESSION: 1. No acute abnormality in the abdomen/pelvis. 2. Uncomplicated cholelithiasis. 3. Incidental uterine fibroids. 4. Small to moderate bilateral pleural effusions and bibasilar atelectasis. Body wall edema suggest third-spacing. 5.  Aortic Atherosclerosis (ICD10-I70.0). Electronically Signed   By: Jeb Levering M.D.   On: 11/30/2017 03:45   Dg Chest 2 View  Result Date: 12/06/2017 CLINICAL DATA:  Shortness of breath today, hypertension, diabetes mellitus, CHF, stage IV chronic kidney disease EXAM: CHEST - 2 VIEW COMPARISON:  12/02/2017 FINDINGS: Enlargement of cardiac silhouette with pulmonary vascular congestion. Atherosclerotic  calcification aorta. Interstitial infiltrates likely representing pulmonary edema. Bibasilar effusions and LEFT basilar atelectasis. No pneumothorax. Bones demineralized with degenerative changes at the shoulders and thoracic spine. IMPRESSION: Persistent pulmonary edema with bibasilar pleural effusions and LEFT basilar atelectasis. Electronically Signed   By: Lavonia Dana M.D.   On: 12/06/2017 16:23   Dg Chest 2 View  Result Date: 11/29/2017 CLINICAL DATA:  Shortness of Breath EXAM: CHEST - 2 VIEW COMPARISON:  None. FINDINGS: There is cardiomegaly with pulmonary venous hypertension. There are small pleural effusions bilaterally. There is patchy airspace opacity in the right mid and lower lung zones. There is mild underlying interstitial pulmonary edema. There is aortic atherosclerosis. No evident adenopathy. 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 IMPRESSION: Findings felt to be indicative of a degree of congestive heart failure. There is airspace consolidation in portions of the right mid lower lung zones. Suspect superimposed pneumonia, although a degree of alveolar edema could present similarly. Both entities may exist concurrently. There is aortic atherosclerosis. 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 Aortic Atherosclerosis (ICD10-I70.0). Electronically Signed   By: Lowella Grip III M.D.   On: 11/29/2017 20:27   Dg Chest Port 1 View  Result Date: 12/02/2017 CLINICAL DATA:  Hypoxia. EXAM: PORTABLE CHEST 1 VIEW COMPARISON:  11/30/2017 and 11/29/2017 FINDINGS: Bilateral interstitial pulmonary edema has slightly improved. There is persistent cardiomegaly and pulmonary vascular congestion as well as bilateral effusions, slightly increased. No acute bone abnormality. IMPRESSION: 1. Slight improvement in pulmonary edema. 2. Slight increase in moderate bilateral effusions. Electronically Signed   By: Lorriane Shire M.D.   On: 12/02/2017 08:09   Dg  Chest Port 1 View  Result Date: 11/30/2017 CLINICAL DATA:  Shortness of breath. EXAM: PORTABLE CHEST 1 VIEW COMPARISON:  11/29/2017 and CT 11/30/2017 FINDINGS: Lungs are adequately inflated demonstrate moderate right hilar and perihilar opacification without significant change. Mild prominence of the left perihilar markings unchanged. Suggestion of small bilateral pleural effusions as seen on the recent CT scan. Stable cardiomegaly. Remainder the exam is unchanged. IMPRESSION: Stable moderate right hilar perihilar opacification which may be due to asymmetric edema versus infection. Right hilar adenopathy or mass is possible. Recommend chest CT with contrast for further evaluation. Small bilateral effusions and stable  cardiomegaly. Electronically Signed   By: Marin Olp M.D.   On: 11/30/2017 08:04     CBC Recent Labs  Lab 11/29/17 1913 11/30/17 1323 12/01/17 0342 12/02/17 0326 12/03/17 0456  WBC 10.9* 21.2* 19.2* 12.7* 13.5*  HGB 6.5* 9.6* 9.9* 9.9* 9.6*  HCT 21.7* 30.7* 31.7* 31.1* 31.2*  PLT 514* 504* 486* 372 393  MCV 76.4* 79.9 79.1 78.5 80.2  MCH 22.9* 25.0* 24.7* 25.0* 24.7*  MCHC 30.0 31.3 31.2 31.8 30.8  RDW 19.7* 19.8* 20.6* 21.5* 22.1*  LYMPHSABS 1.0  --   --   --   --   MONOABS 0.7  --   --   --   --   EOSABS 0.1  --   --   --   --   BASOSABS 0.0  --   --   --   --     Chemistries  Recent Labs  Lab 11/29/17 1913  12/01/17 0342 12/01/17 1631 12/02/17 0326 12/03/17 0456 12/04/17 0510 12/05/17 0432  NA 140   < > 140 141 140 141 138 139  K 4.6   < > 4.4 3.7 3.4* 3.3* 3.5 3.7  CL 107   < > 103 103 102 100* 99* 99*  CO2 22   < > 20* 25 25 26 24 26   GLUCOSE 94   < > 89 95 100* 106* 89 97  BUN 55*   < > 53* 56* 56* 60* 64* 70*  CREATININE 2.25*   < > 2.18* 2.32* 2.33* 2.70* 2.84* 3.05*  CALCIUM 8.9   < > 8.9 8.6* 8.4* 8.2* 8.3* 8.5*  MG  --   --  2.0  --  2.1 2.1  --   --   AST 15  --   --   --  16  --   --   --   ALT 12*  --   --   --  13*  --   --   --   ALKPHOS  136*  --   --   --  110  --   --   --   BILITOT 0.3  --   --   --  0.8  --   --   --    < > = values in this interval not displayed.   ------------------------------------------------------------------------------------------------------------------ No results for input(s): CHOL, HDL, LDLCALC, TRIG, CHOLHDL, LDLDIRECT in the last 72 hours.  Lab Results  Component Value Date   HGBA1C 4.8 07/03/2017   ------------------------------------------------------------------------------------------------------------------ No results for input(s): TSH, T4TOTAL, T3FREE, THYROIDAB in the last 72 hours.  Invalid input(s): FREET3 ------------------------------------------------------------------------------------------------------------------ No results for input(s): VITAMINB12, FOLATE, FERRITIN, TIBC, IRON, RETICCTPCT in the last 72 hours.  Coagulation profile No results for input(s): INR, PROTIME in the last 168 hours.  No results for input(s): DDIMER in the last 72 hours.  Cardiac Enzymes Recent Labs  Lab 11/30/17 1323  TROPONINI 0.04*   ------------------------------------------------------------------------------------------------------------------    Component Value Date/Time   BNP 855.4 (H) 11/30/2017 1323   Roxan Hockey M.D on 12/06/2017 at 6:35 PM  Between 7am to 7pm - Pager - [phone]  After 7pm go to [email or web address] - password [email or web address]  [facility] -  Office  [phone]   Voice Recognition Viviann Spare dictation system was used to create this note, attempts have been made to correct errors. Please contact the author with questions and/or clarifications.

## 2017-12-06 NOTE — Consult Note (Signed)
Consultation Note Date: 12/06/2017   Patient Name: Eileen Cobb  DOB: 09/25/1934  MRN: [id number]  Age / Sex: 83 y.o., female  PCP: Earlyne Iba, MD Referring Physician: Roxan Hockey, MD  Reason for Consultation: Establishing goals of care  HPI/Patient Profile: 83 y.o. female    admitted on 11/29/2017     Clinical Assessment and Goals of Care:  83 year old lady with a past medical history significant for stage IV chronic kidney disease, chronic anemia, hypertension, gout, dyslipidemia, diabetes. Patient lives at [facility] skilled nursing facility. Prior to that she was at a skilled nursing facility in [facility] near her son Eileen Cobb who lives in [place], [place]. The patient has been admitted to hospital medicine service with acute hypoxic respiratory failure, heart failure with preserved ejection fraction, acute on chronic anemia, volume overload, acute kidney injury in addition to underlying stage III-4 chronic kidney disease. Hospital course complicated by the fact that renal function is worsening with escalating creatinine. Patient also with volume overload. Patient requiring multiple blood transfusions. Palliative care consultation for goals of care discussions.  Patient's son lives in [place], [place]. He works Monday through Friday. He is only able to visit over the weekends. A family meeting was set up for goals of care discussions as well as to discuss about the patient's current multiple serious medical conditions. I introduced myself and palliative care as follows: Palliative medicine is specialized medical care for people living with serious illness. It focuses on providing relief from the symptoms and stress of a serious illness. The goal is to improve quality of life for both the patient and the family.  Dr. Denton Brick from [facility]  service was initially also present for the family meeting. Discussed about medical issues pertinent to Mrs. Deluna's care. Ms. Bradham was very clear that she did not want to undergo dialysis. Patient's CODE STATUS was reviewed again as DO NOT RESUSCITATE/DO NOT INTUBATE. We discussed that the patient remains at high risk for gradual progressive decline from her renal function standpoint as well as from her cardiopulmonary status. We discussed about hospice philosophy of care - continuing to focus on making sure she was as well as possible for as long as possible. We discussed about making sure that we avoided pain and suffering as much as possible. Hence, most form was filled out. Will request case managers to assist with addition of hospice as an extra layer of support at [facility] skilled nursing facility at the time of discharge. All of the patient's and family's questions answered to the best of my ability. Most form duly filled out and placed on the writer chart.  NEXT OF KIN  son Eileen Cobb is only child, no spouse.   SUMMARY OF RECOMMENDATIONS    DNR DNI MOST form filled out: DNR DNI no PEG, judicious use of IVF and Abx.  SNF with hospice on discharge.   Code Status/Advance Care Planning:  DNR    Symptom Management:    as above   Palliative  Prophylaxis:   Bowel Regimen   Psycho-social/Spiritual:   Desire for further Chaplaincy support:no  Additional Recommendations: Caregiving  Support/Resources  Prognosis:   < 6 months  Discharge Planning: [place] with Hospice      Primary Diagnoses: Present on Admission: . (Resolved) Acute GI bleeding . Stage 4 chronic kidney disease ([place]) . Type II diabetes mellitus with renal manifestations ([place]) . Essential hypertension . Depression . (Resolved) Acute blood loss anemia . Hypervolemia   I have reviewed the medical record, interviewed the patient and family, and examined the patient. The following aspects are  pertinent.  Past Medical History:  Diagnosis Date  . Bone cancer ([place])   . Depression   . Essential hypertension   . GERD (gastroesophageal reflux disease)   . Gout   . HLD (hyperlipidemia)   . Type II diabetes mellitus with renal manifestations [facility])    Social History   Socioeconomic History  . Marital status: Single    Spouse name: Not on file  . Number of children: Not on file  . Years of education: Not on file  . Highest education level: Not on file  Occupational History  . Not on file  Social Needs  . Financial resource strain: Not on file  . Food insecurity:    Worry: Not on file    Inability: Not on file  . Transportation needs:    Medical: Not on file    Non-medical: Not on file  Tobacco Use  . Smoking status: Never Smoker  . Smokeless tobacco: Never Used  Substance and Sexual Activity  . Alcohol use: No  . Drug use: No  . Sexual activity: Not on file  Lifestyle  . Physical activity:    Days per week: Not on file    Minutes per session: Not on file  . Stress: Not on file  Relationships  . Social connections:    Talks on phone: Not on file    Gets together: Not on file    Attends religious service: Not on file    Active member of club or organization: Not on file    Attends meetings of clubs or organizations: Not on file    Relationship status: Not on file  Other Topics Concern  . Not on file  Social History Narrative  . Not on file   Family History  Problem Relation Age of Onset  . Hypertension Other    Scheduled Meds: . allopurinol  100 mg Oral BID  . amLODipine  5 mg Oral Daily  . atorvastatin  20 mg Oral q1800  . chlorhexidine  15 mL Mouth Rinse BID  . DULoxetine  30 mg Oral Daily  . ferrous sulfate  325 mg Oral BID WC  . folic acid  1 mg Oral Daily  . hydrALAZINE  25 mg Oral Q6H  . insulin aspart  0-9 Units Subcutaneous TID WC  . lubriderm seriously sensitive  1 application Topical BID  . mouth rinse  15 mL Mouth Rinse q12n4p  .  mometasone-formoterol  2 puff Inhalation BID  . multivitamin with minerals  1 tablet Oral Daily  . vitamin C  500 mg Oral Daily   Continuous Infusions: . piperacillin-tazobactam (ZOSYN)  IV Stopped (12/06/17 1748)   PRN Meds:.acetaminophen **OR** acetaminophen, albuterol, hydrALAZINE, iopamidol, ondansetron **OR** ondansetron (ZOFRAN) IV Medications Prior to Admission:  Prior to Admission medications   Medication Sig Start Date End Date Taking? Authorizing Provider  acetaminophen (TYLENOL) 325 MG tablet Take 650 mg  by mouth 2 (two) times daily as needed for mild pain.   Yes [provider]  albuterol (PROVENTIL) (2.5 MG/3ML) 0.083% nebulizer solution Take 2.5 mg by nebulization every 4 (four) hours as needed for wheezing or shortness of breath.   Yes [provider]  allopurinol (ZYLOPRIM) 100 MG tablet Take 100 mg by mouth 2 (two) times daily. 06/24/17  Yes [provider]  amLODipine (NORVASC) 5 MG tablet Take 5 mg by mouth daily. 06/09/17  Yes [provider]  atorvastatin (LIPITOR) 20 MG tablet Take 20 mg by mouth daily. 06/18/17  Yes [provider]  docusate sodium (COLACE) 100 MG capsule Take 100 mg by mouth daily.   Yes [provider]  DULoxetine (CYMBALTA) 30 MG capsule Take 30 mg by mouth daily. 06/09/17  Yes [provider]  Emollient (EUCERIN) lotion Apply 1 mL topically 2 (two) times daily. Apply to body and extremities twice daily   Yes [provider]  epoetin alfa (EPOGEN,PROCRIT) 36644 UNIT/ML injection Inject 20,000 Units into the skin once a week. On Saturdays   Yes [provider]  ferrous sulfate 325 (65 FE) MG tablet Take 325 mg by mouth 2 (two) times daily with a meal.   Yes [provider]  folic acid (FOLVITE) 1 MG tablet Take 1 mg by mouth daily.   Yes [provider]  furosemide (LASIX) 40 MG tablet Take 40 mg by mouth daily. 06/18/17  Yes [provider]    hydrALAZINE (APRESOLINE) 25 MG tablet Take 25 mg by mouth every 6 (six) hours. 04/28/17  Yes [provider]  insulin NPH Human (HUMULIN N,NOVOLIN N) 100 UNIT/ML injection Inject 2-8 Units into the skin 2 (two) times daily as needed (high blood sugar). SLIDING SCALE BEFORE MEALS-CBG 201-250=2 UNITS, 251-300=4 UNITS, 301-350=6 UNITS, 351-400=8 UNITS, OVER 400 CALL MD, LESS THAN 60=ORANGE JUICE AND RECHECK IN 30 MINUTES    Yes [provider]  multivitamin-iron-minerals-folic acid (CENTRUM) chewable tablet Chew 1 tablet by mouth daily.   Yes [provider]  omeprazole (PRILOSEC) 20 MG capsule Take 20 mg by mouth 2 (two) times daily.   Yes [provider]  Propylene Glycol (SYSTANE BALANCE OP) Place 1 drop into both eyes 2 (two) times daily. FOR DRY EYE SYNDROME   Yes [provider]  SYMBICORT 160-4.5 MCG/ACT inhaler Take 2 puffs by mouth 2 (two) times daily. 06/06/17  Yes [provider]  vitamin C (ASCORBIC ACID) 500 MG tablet Take 500 mg by mouth daily.   Yes [provider]  polyethylene glycol (MIRALAX) packet Take 17 g by mouth daily as needed for mild constipation. Patient not taking: Reported on 11/29/2017 07/05/17   Barton Dubois, MD   No Known Allergies Review of Systems Denies pain  Physical Exam  Gen:- Awake Alert,  Talking HEENT:- [place].AT, No sclera icterus Nose- [place] 2 L/min Neck-Supple Neck,No JVD,.  Lungs-  CTAB , fair movement CV- S1, S2 normal Abd-  +ve B.Sounds, Abd Soft, No tenderness,    Extremity/Skin:- Lt LE with intact pulses, right AKA   oriented x3 Neuro-no new focal deficits, no tremors   Vital Signs: BP (!) 146/60 (BP Location: Left Arm)   Pulse 91   Temp 99.5 F (37.5 C) (Oral)   Resp 16   Ht 5\' 2"  (1.575 m)   Wt 81.8 kg (180 lb 5.4 oz)   SpO2 95%   BMI 32.98 kg/m  Pain Scale: 0-10 POSS *See Group Information*: 1-Acceptable,Awake and  alert Pain Score: 0-No pain   SpO2: SpO2: 95 % O2  Device:SpO2: 95 % O2 Flow Rate: .O2 Flow Rate (L/min): 2 L/min  IO: Intake/output summary:   Intake/Output Summary (Last 24 hours) at 12/06/2017 1814 Last data filed at 12/06/2017 1400 Gross per 24 hour  Intake 200 ml  Output -  Net 200 ml   PPS 30% LBM: Last BM Date: 12/06/17 Baseline Weight: Weight: 87.3 kg (192 lb 7.4 oz) Most recent weight: Weight: 81.8 kg (180 lb 5.4 oz)     Palliative Assessment/Data:     Time In:  1700 Time Out:  1815 Time Total:  75  Greater than 50%  of this time was spent counseling and coordinating care related to the above assessment and plan.  Signed by: Loistine Chance, MD  [phone]  Please contact Palliative Medicine Team phone at [phone] for questions and concerns.  For individual provider: See Shea Evans

## 2017-12-07 LAB — BASIC METABOLIC PANEL
ANION GAP: 17 — AB (ref 5–15)
BUN: 75 mg/dL — ABNORMAL HIGH (ref 6–20)
CO2: 26 mmol/L (ref 22–32)
Calcium: 8.5 mg/dL — ABNORMAL LOW (ref 8.9–10.3)
Chloride: 96 mmol/L — ABNORMAL LOW (ref 101–111)
Creatinine, Ser: 2.94 mg/dL — ABNORMAL HIGH (ref 0.44–1.00)
GFR calc Af Amer: 16 mL/min — ABNORMAL LOW (ref >60)
GFR calc non Af Amer: 14 mL/min — ABNORMAL LOW (ref >60)
GLUCOSE: 92 mg/dL (ref 65–99)
POTASSIUM: 3.1 mmol/L — AB (ref 3.5–5.1)
Sodium: 139 mmol/L (ref 135–145)

## 2017-12-07 LAB — CBC
HEMATOCRIT: 30.2 % — AB (ref 36.0–46.0)
Hemoglobin: 9.3 g/dL — ABNORMAL LOW (ref 12.0–15.0)
MCH: 24.3 pg — ABNORMAL LOW (ref 26.0–34.0)
MCHC: 30.8 g/dL (ref 30.0–36.0)
MCV: 79.1 fL (ref 78.0–100.0)
Platelets: 317 10*3/uL (ref 150–400)
RBC: 3.82 MIL/uL — AB (ref 3.87–5.11)
RDW: 22.5 % — ABNORMAL HIGH (ref 11.5–15.5)
WBC: 12.3 10*3/uL — AB (ref 4.0–10.5)

## 2017-12-07 LAB — GLUCOSE, CAPILLARY
Glucose-Capillary: 88 mg/dL (ref 65–99)
Glucose-Capillary: 93 mg/dL (ref 65–99)
Glucose-Capillary: 104 mg/dL — ABNORMAL HIGH (ref 65–99)

## 2017-12-07 MED ORDER — POTASSIUM CHLORIDE CRYS ER 20 MEQ PO TBCR: 40.0000 meq | EXTENDED_RELEASE_TABLET | Freq: Once | ORAL | Status: DC

## 2017-12-07 MED ORDER — HYDRALAZINE HCL 25 MG PO TABS: 25.0000 mg | ORAL_TABLET | Freq: Four times a day (QID) | ORAL | 1 refills | Status: AC

## 2017-12-07 MED ORDER — ONDANSETRON 4 MG PO TBDP: 4.0000 mg | ORAL_TABLET | Freq: Three times a day (TID) | ORAL | 0 refills | Status: AC | PRN

## 2017-12-07 MED ORDER — ONDANSETRON HCL 4 MG/2ML IJ SOLN
4.0000 mg | Freq: Once | INTRAMUSCULAR | Status: AC
  Administered 2017-12-07: 4 mg via INTRAVENOUS
  Filled 2017-12-07: qty 2

## 2017-12-07 MED ORDER — ADULT MULTIVITAMIN W/MINERALS CH: 1.0000 | ORAL_TABLET | Freq: Every day | ORAL | 1 refills | Status: AC

## 2017-12-07 MED ORDER — FUROSEMIDE 40 MG PO TABS: 40.0000 mg | ORAL_TABLET | Freq: Every day | ORAL | 2 refills | Status: AC

## 2017-12-07 MED ORDER — ACETAMINOPHEN 325 MG PO TABS: 650.0000 mg | ORAL_TABLET | Freq: Four times a day (QID) | ORAL | 0 refills | Status: DC | PRN

## 2017-12-07 MED ORDER — POTASSIUM CHLORIDE CRYS ER 20 MEQ PO TBCR
40.0000 meq | EXTENDED_RELEASE_TABLET | Freq: Once | ORAL | Status: AC
  Administered 2017-12-07: 40 meq via ORAL
  Filled 2017-12-07: qty 2

## 2017-12-07 MED ORDER — FUROSEMIDE 10 MG/ML IJ SOLN: 40.0000 mg | Freq: Once | INTRAMUSCULAR | Status: DC

## 2017-12-07 MED ORDER — GUAIFENESIN ER 600 MG PO TB12: 600.0000 mg | ORAL_TABLET | Freq: Two times a day (BID) | ORAL | 0 refills | Status: AC

## 2017-12-07 MED ORDER — ALBUTEROL SULFATE (2.5 MG/3ML) 0.083% IN NEBU: 2.5000 mg | INHALATION_SOLUTION | Freq: Three times a day (TID) | RESPIRATORY_TRACT | 12 refills | Status: DC

## 2017-12-07 NOTE — Discharge Instructions (Signed)
1)Discharge to skilled nursing facility with palliative/hospice services--- please see attached MOST form 2) call patient and son Eileen Cobb if patient decompensates prior to making any decisions to transfer to ED 3) please consult palliative/hospice services when patient arrives in skilled nursing facility-to help with goals of keeping patient comfortable 4) please continue supplemental oxygen and suction as needed 5)Consider  weekly EPO shots to reduce frequently/incidents ofsymptomatic anemia

## 2017-12-07 NOTE — Discharge Summary (Signed)
Eileen Cobb, is a 83 y.o. female  DOB 10/29/1934  MRN [id number].  Admission date:  11/29/2017  Admitting Physician  [name], MD  Discharge Date:  12/07/2017   Primary MD  [name], Manya Silvas, MD  Recommendations for primary care physician for things to follow:   1)Discharge to skilled nursing facility with palliative/hospice services--- please see attached MOST form 2) call patient and son Eileen Cobb if patient decompensates prior to making any decisions to transfer to ED 3) please consult palliative/hospice services when patient arrives in skilled nursing facility-to help with goals of keeping patient comfortable 4) please continue supplemental oxygen and suction as needed 5)Consider  weekly EPO shots to reduce frequently/incidents ofsymptomatic anemia  Admission Diagnosis  AKI (acute kidney injury) ([place]) [N17.9] Anemia, unspecified type [D64.9] Acute GI bleeding [K92.2]   Discharge Diagnosis  AKI (acute kidney injury) ([place]) [N17.9] Anemia, unspecified type [D64.9] Acute GI bleeding [K92.2]    Principal Problem:   Acute on chronic diastolic CHF (congestive heart failure) ([place]) Active Problems:   Essential hypertension   Type II diabetes mellitus with renal manifestations ([place])   Depression   Stage 4 chronic kidney disease ([place])   Chronic blood loss anemia   Hypervolemia      Past Medical History:  Diagnosis Date  . Bone cancer ([place])   . Depression   . Essential hypertension   . GERD (gastroesophageal reflux disease)   . Gout   . HLD (hyperlipidemia)   . Type II diabetes mellitus with renal manifestations [facility])     Past Surgical History:  Procedure Laterality Date  . ABOVE KNEE LEG AMPUTATION Right        HPI  from the history and physical done on the day of admission:    PCP: Earlyne Iba, MD  Patient coming from: Nursing home.  Chief Complaint:  Low hemoglobin.  HPI: Eileen Cobb is a 83 y.o. female with history of CHF, hypertension, diabetes mellitus, anemia and previous history of GI bleed was treated conservatively with transfusion as patient was found to be at high risk for any procedure was brought in from the nursing home after patient's hemoglobin was found to be around 5.9.  Patient's recent hemoglobin done at the nursing home was around 7.12 weeks ago which has dropped to 5.9.  Patient also has benign further worsening shortness of breath last 2 weeks with patient stating that she also has been exam nausea vomiting unable to keep in anything.  Patient denies any abdominal pain or diarrhea.  ED Course: In the ER patient is found to be hypoxic with a hemoglobin around 6.  Chest x-ray shows congestion concerning for CHF and possible pneumonia.  Patient stool for occult blood was positive.  Dr. Judith Part on-call gastroenterologist was consulted and will seen patient in consult.  2 units of PRBC has been ordered.  And I have also started patient on antibiotics for possible pneumonia and ordered 1 dose of Lasix IV because patient appeared to be fluid overloaded.  Abdomen on exam appears benign.  LFTs are normal.    Hospital Course:     Brief Narrative: 83 year old with past medical history relevant for stage IV chronic kidney disease, chronic anemia due to multiple hypoproliferative causes, hypertension, gout, hyperlipidemia, type 2 diabetes on insulin who comes in from nursing home found to be anemic and short of breath. Her course has been complicated by acute likely diastolic heart failure exacerbation possibly secondary to high output heart failure vs TACO vs transfusion reaction/TRALI.  11/29/2017 patient was transfused 2 units of packed cells, there was concern about volume overload, she was subsequently diuresed resulting in elevation of her creatinine, review of records suggest CKD stage III at baseline with baseline creatinine  1.5-1.6 range, Consider  weekly EPO shots to reduce frequently/incidents of symptomatic anemia   Assessment & Plan:  1) Acute on Chronic Hypoxic Respiratory Failure: At baseline prior to admission patient was on 2 L of oxygen via nasal cannula ---- continues to require 2-3 L at this time,, clinically and radiologically respiratory failure was presumed to be  secondary to fluid overload/diastolic CHF exacerbation,   patient was treated with vancomycin and Zosyn, as per nephrologist Dr Jonnie Finner diuretics have been de-escalated,   STOP metolazone on 12/05/17,  C/n  BiPAP nightly and as needed, after discontinuing all diuretics patient became more short of breath so we will go ahead and discharge her on Lasix 40 mg daily.   2)HFpEF- pt with acute on chronic diastolic dysfunction CHF, see #1 above, clinically and radiologically CHF findings persist, with persistent hypoxia, last known EF over 60%, nput and output is not accurate as patient is incontinent, as per nephrologist Dr Jonnie Finner , as per nephrologist Dr Jonnie Finner diuretics have been de-escalated,   STOP metolazone on 12/05/17,  C/n  BiPAP nightly and as needed, after discontinuing all diuretics patient became more short of breath so we will go ahead and discharge her on Lasix 40 mg daily.   3)) Acute on chronic anemia: Multifactorial including occult GI losses, anemia of chronic disease from CKD, GI signed off as patient is not stable enough to allow for EGD/colonoscopy, weekly EPO injections are currently on hold, continue iron sulfate tab.  Workup this admission revealed iron deficiency with serum iron of 14 saturation of 5%, folate and B12 are not low.  Hemoglobin has been stable above 9, recheck on 12/06/17.  In the future should patient require transfusion this would be very tricky as she is at risk for volume overload, and if she goes into volume overload from transfusion of packed cells, it will  be very difficult to diurese due to poor kidney  function.  Consider  weekly EPO shots to reduce frequently/incidents ofsymptomatic anemia  4)Hypertension:-Stable, c/n Amlodipine 5 mg, Continue hydralazine 25 mg every 6 hours  5)Type 2 diabetes on insulin:-Last known A1c 4.8, use Novolog/Humalog Sliding scale insulin with Accu-Cheks/Fingersticks as ordered  6) AKI on CKD III-IV- renal function is worsening, creatinine is around  3.0,  creatinine was 2.2 on 11/29/2017 ,  5 months ago creatinine was in the 1.5-1.6 range.  Review of records suggest at baseline patient has CKD stage III to IV, stop metolazone on 12/05/2017, continue Lasix 60 mg twice daily, discussed with Dr. Jonnie Finner from Nephrology service, official consult not done as Patient refused to talk to Dr Jonnie Finner when he went to her room on 12/06/2017, patient is leaning towards de-escalating treatment rather than getting further consultations with specialist physicians . 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   11)Social/Ethics-palliative care conference with patient's son Eileen Cobb from [place] with palliative care team and myself on 12/06/2017 and to delineate goals of care,  Patient has chronic anemia requiring transfusion from time to time however with transfusion she is at risk for volume overload due to diastolic dysfunction CHF, it is difficult to diurese patient due to poor renal function , I hypoxia persist, clinically and on chest x-ray she continues to have pulmonary edema, nephrologist advises against aggressive loop diuretics at this time due to kidney concerns however CHF concerns persist .  After palliative care conference today patient remains DNR DNI, MOST form filled out: DNR DNI  no PEG, judicious use of IVF and Abx.    Discharge to skilled nursing facility with palliative/hospice services--- please see attached MOST form 2) call patient and son Eileen Cobb if patient decompensates prior to making any decisions to transfer to ED 3) please consult palliative/hospice services when patient arrives in skilled nursing facility-to help with goals of keeping patient comfortable 4) please continue supplemental oxygen and suction as needed 5)Consider  weekly EPO shots to reduce frequently/incidents ofsymptomatic anemia   12)Gout:-Continue allopurinol 100 mg twice a day  Prophylaxis: SCDs  Disposition: SNF with hospice on discharge.  Consultants:  Palliative care   Procedures:   Echo 1/51/7616 stage I diastolic dysfunction  Antimicrobials   Vancomycin and Zosyn started 11/30/2017 Stopped vancomycin on 12/03/2017 Zosyn stopped 12/06/17   Discharge Condition: Overall prognosis is poor  Follow UP  Contact information for after-discharge care    Destination    [facility] .   Service:  Skilled Nursing Contact information: [address] [place] [phone]             Diet and Activity recommendation:  As advised  Discharge Instructions    Discharge Instructions    Diet - low sodium heart healthy   Complete by:  As directed    Discharge instructions   Complete by:  As directed    1)Discharge to skilled nursing facility with palliative/hospice services--- please see attached MOST form 2) call patient and son Eileen Cobb if patient decompensates prior to making any decisions to transfer to ED 3) please consult palliative/hospice services when patient arrives in skilled nursing facility-to help with goals of keeping patient comfortable 4) please continue supplemental oxygen and suction as needed 5)Consider  weekly EPO shots to reduce frequently/incidents ofsymptomatic anemia   Increase activity slowly   Complete  by:  As directed         Discharge Medications     Allergies as of 12/07/2017   No Known Allergies     Medication List    TAKE these medications   albuterol (2.5 MG/3ML) 0.083% nebulizer solution Commonly known as:  PROVENTIL Take 2.5 mg by nebulization every 4 (four) hours as needed for wheezing or shortness of breath. What changed:  Another medication with the same name was added. Make sure you understand how and when to take each.   albuterol (2.5 MG/3ML) 0.083% nebulizer solution Commonly known as:  PROVENTIL Take 3 mLs (2.5 mg total) by nebulization 3 (three) times daily. What changed:  You were already taking a medication with the same name,  and this prescription was added. Make sure you understand how and when to take each.   allopurinol 100 MG tablet Commonly known as:  ZYLOPRIM Take 100 mg by mouth 2 (two) times daily.   amLODipine 5 MG tablet Commonly known as:  NORVASC Take 5 mg by mouth daily.   atorvastatin 20 MG tablet Commonly known as:  LIPITOR Take 20 mg by mouth daily.   docusate sodium 100 MG capsule Commonly known as:  COLACE Take 100 mg by mouth daily.   DULoxetine 30 MG capsule Commonly known as:  CYMBALTA Take 30 mg by mouth daily.   epoetin alfa 20000 UNIT/ML injection Commonly known as:  EPOGEN,PROCRIT Inject 20,000 Units into the skin once a week. On Saturdays   eucerin lotion Apply 1 mL topically 2 (two) times daily. Apply to body and extremities twice daily   ferrous sulfate 325 (65 FE) MG tablet Take 325 mg by mouth 2 (two) times daily with a meal.   folic acid 1 MG tablet Commonly known as:  FOLVITE Take 1 mg by mouth daily.   furosemide 40 MG tablet Commonly known as:  LASIX Take 1 tablet (40 mg total) by mouth daily.   guaiFENesin 600 MG 12 hr tablet Commonly known as:  MUCINEX Take 1 tablet (600 mg total) by mouth 2 (two) times daily for 10 days.   hydrALAZINE 25 MG tablet Commonly known as:  APRESOLINE Take 1  tablet (25 mg total) by mouth 4 (four) times daily. What changed:  when to take this   insulin NPH Human 100 UNIT/ML injection Commonly known as:  HUMULIN N,NOVOLIN N Inject 2-8 Units into the skin 2 (two) times daily as needed (high blood sugar). SLIDING SCALE BEFORE MEALS-CBG 201-250=2 UNITS, 251-300=4 UNITS, 301-350=6 UNITS, 351-400=8 UNITS, OVER 400 CALL MD, LESS THAN 60=ORANGE JUICE AND RECHECK IN 30 MINUTES   multivitamin with minerals Tabs tablet Take 1 tablet by mouth daily. Start taking on:  12/08/2017   multivitamin-iron-minerals-folic acid chewable tablet Chew 1 tablet by mouth daily.   omeprazole 20 MG capsule Commonly known as:  PRILOSEC Take 20 mg by mouth 2 (two) times daily.   ondansetron 4 MG disintegrating tablet Commonly known as:  ZOFRAN ODT Take 1 tablet (4 mg total) by mouth every 8 (eight) hours as needed for nausea or vomiting.   polyethylene glycol packet Commonly known as:  MIRALAX Take 17 g by mouth daily as needed for mild constipation.   SYMBICORT 160-4.5 MCG/ACT inhaler Generic drug:  budesonide-formoterol Take 2 puffs by mouth 2 (two) times daily.   SYSTANE BALANCE OP Place 1 drop into both eyes 2 (two) times daily. FOR DRY EYE SYNDROME   TYLENOL 325 MG tablet Generic drug:  acetaminophen Take 650 mg by mouth 2 (two) times daily as needed for mild pain. What changed:  Another medication with the same name was added. Make sure you understand how and when to take each.   acetaminophen 325 MG tablet Commonly known as:  TYLENOL Take 2 tablets (650 mg total) by mouth every 6 (six) hours as needed for mild pain (or Fever >/= 101). What changed:  You were already taking a medication with the same name, and this prescription was added. Make sure you understand how and when to take each.   vitamin C 500 MG tablet Commonly known as:  ASCORBIC ACID Take 500 mg by mouth daily.       Major procedures and Radiology Reports - PLEASE review detailed and  final reports for all details, in brief -    Ct Abdomen Pelvis Wo Contrast  Result Date: 11/30/2017 CLINICAL DATA:  Nausea and vomiting. EXAM: CT ABDOMEN AND PELVIS WITHOUT CONTRAST TECHNIQUE: Multidetector CT imaging of the abdomen and pelvis was performed following the standard protocol without IV contrast. COMPARISON:  None. FINDINGS: Lower chest: Small to moderate bilateral pleural effusions and adjacent compressive atelectasis. Mild cardiomegaly. Hepatobiliary: Motion artifact through the liver. No evidence of focal hepatic lesion. Small gallstones within physiologically distended gallbladder. No pericholecystic inflammation. No biliary dilatation. Pancreas: No ductal dilatation or inflammation. Spleen: Normal in size without focal abnormality. Adrenals/Urinary Tract: Mild left adrenal thickening without dominant nodule. Right kidney is not seen. No left hydronephrosis. No urolithiasis. Urinary bladder is partially distended. No bladder wall thickening. Stomach/Bowel: Patient motion limits bowel assessment. Stomach is nondistended. No small bowel wall thickening, inflammatory change or obstruction. Enteric contrast reaches the distal small bowel. Appendix tentatively visualized and normal. Moderate volume of stool throughout the colon. There is sigmoid colonic tortuosity. No colonic wall thickening or inflammatory change. Vascular/Lymphatic: Aorto bi-iliac atherosclerosis. No enlarged abdominal or pelvic lymph nodes. Reproductive: Enlarged uterus with multiple fibroids, many of which are calcified. No adnexal mass. Other: Generalized body wall edema. No ascites or free air. No intra-abdominal abscess. Tiny fat containing umbilical hernia. Musculoskeletal: Degenerative change in the lumbar spine. There are no acute or suspicious osseous abnormalities. Mild fatty infiltration of paraspinal musculature. IMPRESSION: 1. No acute abnormality in the abdomen/pelvis. 2. Uncomplicated cholelithiasis. 3. Incidental  uterine fibroids. 4. Small to moderate bilateral pleural effusions and bibasilar atelectasis. Body wall edema suggest third-spacing. 5.  Aortic Atherosclerosis (ICD10-I70.0). Electronically Signed   By: Jeb Levering M.D.   On: 11/30/2017 03:45   Dg Chest 2 View  Result Date: 12/06/2017 CLINICAL DATA:  Shortness of breath today, hypertension, diabetes mellitus, CHF, stage IV chronic kidney disease EXAM: CHEST - 2 VIEW COMPARISON:  12/02/2017 FINDINGS: Enlargement of cardiac silhouette with pulmonary vascular congestion. Atherosclerotic calcification aorta. Interstitial infiltrates likely representing pulmonary edema. Bibasilar effusions and LEFT basilar atelectasis. No pneumothorax. Bones demineralized with degenerative changes at the shoulders and thoracic spine. IMPRESSION: Persistent pulmonary edema with bibasilar pleural effusions and LEFT basilar atelectasis. Electronically Signed   By: Lavonia Dana M.D.   On: 12/06/2017 16:23   Dg Chest 2 View  Result Date: 11/29/2017 CLINICAL DATA:  Shortness of Breath EXAM: CHEST - 2 VIEW COMPARISON:  None. FINDINGS: There is cardiomegaly with pulmonary venous hypertension. There are small pleural effusions bilaterally. There is patchy airspace opacity in the right mid and lower lung zones. There is mild underlying interstitial pulmonary edema. There is aortic atherosclerosis. No evident adenopathy. 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 IMPRESSION: Findings felt to be indicative of a degree of congestive heart failure. There is airspace consolidation in portions of the right mid lower lung zones. Suspect superimposed pneumonia, although a degree of alveolar edema could present similarly. Both entities may exist concurrently. There is aortic atherosclerosis. 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 Aortic Atherosclerosis (ICD10-I70.0). Electronically Signed   By: Lowella Grip III M.D.   On: 11/29/2017  20:27   Dg Chest Port 1 View  Result Date: 12/02/2017 CLINICAL DATA:  Hypoxia. EXAM: PORTABLE CHEST 1 VIEW COMPARISON:  11/30/2017 and 11/29/2017 FINDINGS: Bilateral interstitial pulmonary edema has slightly improved. There is persistent cardiomegaly and pulmonary vascular congestion as well as bilateral effusions, slightly increased. No acute bone abnormality. IMPRESSION: 1. Slight  improvement in pulmonary edema. 2. Slight increase in moderate bilateral effusions. Electronically Signed   By: Lorriane Shire M.D.   On: 12/02/2017 08:09   Dg Chest Port 1 View  Result Date: 11/30/2017 CLINICAL DATA:  Shortness of breath. EXAM: PORTABLE CHEST 1 VIEW COMPARISON:  11/29/2017 and CT 11/30/2017 FINDINGS: Lungs are adequately inflated demonstrate moderate right hilar and perihilar opacification without significant change. Mild prominence of the left perihilar markings unchanged. Suggestion of small bilateral pleural effusions as seen on the recent CT scan. Stable cardiomegaly. Remainder the exam is unchanged. IMPRESSION: Stable moderate right hilar perihilar opacification which may be due to asymmetric edema versus infection. Right hilar adenopathy or mass is possible. Recommend chest CT with contrast for further evaluation. Small bilateral effusions and stable cardiomegaly. Electronically Signed   By: Marin Olp M.D.   On: 11/30/2017 08:04    Micro Results    Recent Results (from the past 240 hour(s))  MRSA PCR Screening     Status: Abnormal   Collection Time: 11/29/17 11:58 PM  Result Value Ref Range Status   MRSA by PCR POSITIVE (A) NEGATIVE Final    Comment:        The GeneXpert MRSA Assay (FDA approved for NASAL specimens only), is one component of a comprehensive MRSA colonization surveillance program. It is not intended to diagnose MRSA infection nor to guide or monitor treatment for MRSA infections. RESULT CALLED TO, READ BACK BY AND VERIFIED WITH: D BROWN 11/30/17 0226  RHOLMES Performed at [facility], [place] [address], [place], [place] [address]     Today   Subjective    [name] today had emesis, improved after Zofran, no fevers, bit more congested, so lasix wasgiven          Patient has been seen and examined prior to discharge   Objective   Blood pressure (!) 155/64, pulse 90, temperature 98.6 F (37 C), temperature source Oral, resp. rate 16, height 5\' 2"  (1.575 m), weight 81 kg (178 lb 9.2 oz), SpO2 95 %.   Intake/Output Summary (Last 24 hours) at 12/07/2017 1519 Last data filed at 12/07/2017 1353 Gross per 24 hour  Intake 450 ml  Output 400 ml  Net 50 ml    Exam  Gen:- Awake Alert,  Talking HEENT:- [place].AT, No sclera icterus Nose-  2 L/min Neck-Supple Neck,No JVD,.  Lungs-  diminished in bases, sounded slightly wet earlier, received IV Lasix sounds better now CV- S1, S2 normal Abd-  +ve B.Sounds, Abd Soft, No tenderness,    Extremity/Skin:- Lt LE with intact pulses, right AKA Psych-flat affect, oriented x3 Neuro-no new focal deficits, no tremors     Data Review   CBC w Diff:  Lab Results  Component Value Date   WBC 12.3 (H) 12/07/2017   HGB 9.3 (L) 12/07/2017   HCT 30.2 (L) 12/07/2017   PLT 317 12/07/2017   LYMPHOPCT 10 11/29/2017   MONOPCT 6 11/29/2017   EOSPCT 1 11/29/2017   BASOPCT 0 11/29/2017    CMP:  Lab Results  Component Value Date   NA 139 12/07/2017   K 3.1 (L) 12/07/2017   CL 96 (L) 12/07/2017   CO2 26 12/07/2017   BUN 75 (H) 12/07/2017   CREATININE 2.94 (H) 12/07/2017   PROT 5.9 (L) 12/02/2017   ALBUMIN 2.7 (L) 12/02/2017   BILITOT 0.8 12/02/2017   ALKPHOS 110 12/02/2017   AST 16 12/02/2017   ALT 13 (L) 12/02/2017  .  Total Discharge time  is about 33 minutes  Roxan Hockey M.D on 12/07/2017 at 3:19 PM  [facility]   Office  [phone]  Voice Recognition Viviann Spare dictation system was used to create this note, attempts have been made to correct errors.  Please contact the author with questions and/or clarifications.

## 2017-12-07 NOTE — Progress Notes (Signed)
Daily Progress Note   Patient Name: Eileen Cobb       Date: 12/07/2017 DOB: Dec 04, 1934  Age: 83 y.o. MRN#: [id number] Attending Physician: Eileen Hockey, MD Primary Care Physician: Eileen Bruce, Manya Silvas, MD Admit Date: 11/29/2017  Reason for Consultation/Follow-up: Establishing goals of care  Subjective:  patient is awake resting in bed She appears weak chronically ill deconditioned Eileen Cobb says she threw up earlier today She has increasing pulmonary congestion, has a wet cough, is trying to spit up clear secretions Denies pain No family at bedside, I met with her only son, next of kin Eileen Cobb on 12-06-17. A MOST form was completed, plans are for SNF with hospice when she is deemed to have reached the end of this hospitalization. Prognosis remains guarded given multiple serious illnesses.    Length of Stay: 7  Current Medications: Scheduled Meds:  . allopurinol  100 mg Oral BID  . amLODipine  5 mg Oral Daily  . atorvastatin  20 mg Oral q1800  . chlorhexidine  15 mL Mouth Rinse BID  . DULoxetine  30 mg Oral Daily  . ferrous sulfate  325 mg Oral BID WC  . folic acid  1 mg Oral Daily  . hydrALAZINE  25 mg Oral Q6H  . insulin aspart  0-9 Units Subcutaneous TID WC  . lubriderm seriously sensitive  1 application Topical BID  . mouth rinse  15 mL Mouth Rinse q12n4p  . mometasone-formoterol  2 puff Inhalation BID  . multivitamin with minerals  1 tablet Oral Daily  . ondansetron (ZOFRAN) IV  4 mg Intravenous Once  . potassium chloride  40 mEq Oral Once  . potassium chloride  40 mEq Oral Once  . vitamin C  500 mg Oral Daily    Continuous Infusions:   PRN Meds: acetaminophen **OR** acetaminophen, albuterol, hydrALAZINE, iopamidol, ondansetron **OR** ondansetron (ZOFRAN)  IV  Physical Exam         Awake alert Coarse wet breath sounds S 1 S 2  Abdomen distended Has R AKA Flat affect No acute distress  Vital Signs: BP (!) 155/64 (BP Location: Left Arm)   Pulse 90   Temp 98.6 F (37 C) (Oral)   Resp 16   Ht _0  (1.575 m)   Wt 81 kg (178 lb 9.2 oz)  SpO2 95%   BMI 32.66 kg/m  SpO2: SpO2: 95 % O2 Device: O2 Device: Nasal Cannula O2 Flow Rate: O2 Flow Rate (L/min): 1 L/min  Intake/output summary:   Intake/Output Summary (Last 24 hours) at 12/07/2017 1103 Last data filed at 12/07/2017 0600 Gross per 24 hour  Intake 260 ml  Output 400 ml  Net -140 ml   LBM: Last BM Date: 12/06/17 Baseline Weight: Weight: 87.3 kg (192 lb 7.4 oz) Most recent weight: Weight: 81 kg (178 lb 9.2 oz)       Palliative Assessment/Data:    PPS 30%  Patient Active Problem List   Diagnosis Date Noted  . Acute on chronic diastolic CHF (congestive heart failure) ([place]) 11/30/2017  . Chronic blood loss anemia 11/30/2017  . Hypervolemia 11/30/2017  . Anemia of chronic disease   . Stage 4 chronic kidney disease ([place])   . Normocytic anemia 07/02/2017  . GIB (gastrointestinal bleeding) 07/02/2017  . Essential hypertension   . HLD (hyperlipidemia)   . GERD (gastroesophageal reflux disease)   . Type II diabetes mellitus with renal manifestations ([place])   . Gout   . Depression     Palliative Care Assessment & Plan   Patient Profile:  83 year old lady with a past medical history significant for stage IV chronic kidney disease, chronic anemia, hypertension, gout, dyslipidemia, diabetes. Patient lives at [facility] skilled nursing facility. Prior to that she was at a skilled nursing facility in [facility] near her son Eileen Cobb who lives in [place], [place]. The patient has been admitted to hospital medicine service with acute hypoxic respiratory failure, heart failure with preserved ejection fraction, acute on chronic anemia, volume overload,  acute kidney injury in addition to underlying stage III-4 chronic kidney disease. Hospital course complicated by the fact that renal function is worsening with escalating creatinine. Patient also with volume overload. Patient requiring multiple blood transfusions. Palliative care consultation for goals of care discussions.  Assessment:  nausea vomiting this am Creatinine some what improved Ongoing pulmonary congestion/ volume over load clinically.  Recommendations/Plan:   Agree with IV Zofran PRN Add Yankauer suction PRN  Continue current scope of care  Recommend SNF with hospice on discharge  MOST form completed  Will need residential hospice if she has sudden acute decompensation in this hospitalization, PMT to continue to follow.    Code Status:    Code Status Orders  (From admission, onward)        Start     Ordered   11/30/17 0738  Do not attempt resuscitation (DNR)  Continuous    Question Answer Comment  In the event of cardiac or respiratory ARREST Do not call a "code blue"   In the event of cardiac or respiratory ARREST Do not perform Intubation, CPR, defibrillation or ACLS   In the event of cardiac or respiratory ARREST Use medication by any route, position, wound care, and other measures to relive pain and suffering. May use oxygen, suction and manual treatment of airway obstruction as needed for comfort.      11/30/17 0737    Code Status History    Date Active Date Inactive Code Status Order ID Comments User Context   11/29/2017 2146 11/30/2017 0737 Full Code [id number]  Eileen Patience, MD ED   07/02/2017 1955 07/05/2017 2109 Full Code [id number]  Eileen Costa, MD ED       Prognosis:   < 6 months  Discharge Planning:  [facility] with Hospice  Care plan was  discussed with  Patient.   Thank you for allowing the Palliative Medicine Team to assist in the care of this patient.   Time In: 10 Time Out: 10.25 Total Time 25 Prolonged Time  Billed  no       Greater than 50%  of this time was spent counseling and coordinating care related to the above assessment and plan.  Eileen Chance, MD [phone]  Please contact Palliative Medicine Team phone at [phone] for questions and concerns.

## 2017-12-07 NOTE — Progress Notes (Signed)
Patient from [facility] for [facility]. CSW following to assist with patient discharging back to SNF with palliative/hospice services. CSW spoke with staff member Alyse Low from [facility] who confirmed patient's ability to return to SNF. CSW spoke with patient's son who confirmed plan for patient to dc back to [facility] and that he would follow up with SNF about transferring patient closer to him. [facility] contacted, patient's family notified. Patient's RN can call report to [phone], packet complete. CSW signing off, no other needs identified at this time.   Abundio Miu, [place] Social Worker [facility] Cell#: [phone]

## 2017-12-07 NOTE — Care Management Important Message (Addendum)
Important Message  Patient Details IM Letter given to Cookie/Case Manager to present to the Patient Name: Eileen Cobb MRN: [id number] Date of Birth: Feb 04, 1935   Medicare Important Message Given:  Yes    Kerin Salen 12/07/2017, 12:01 [place] Message  Patient Details  Name: Eileen Cobb MRN: [id number] Date of Birth: August 17, 1935   Medicare Important Message Given:  Yes    Kerin Salen 12/07/2017, 12:01 PM

## 2017-12-07 NOTE — Progress Notes (Signed)
Pt d/c back to [facility], report called to Nurse caring for patient. Waiting for EMS arrival. [facility]

## 2018-01-25 ENCOUNTER — Emergency Department (HOSPITAL_COMMUNITY): Payer: Medicare Other

## 2018-01-25 ENCOUNTER — Emergency Department (HOSPITAL_COMMUNITY)
Admission: EM | Admit: 2018-01-25 | Discharge: 2018-01-25 | Disposition: A | Discharge status: Home / Self Care | Payer: Medicare Other | Attending: Emergency Medicine | Admitting: Emergency Medicine

## 2018-01-25 ENCOUNTER — Encounter (HOSPITAL_COMMUNITY): Payer: Self-pay | Admitting: *Deleted

## 2018-01-25 DIAGNOSIS — Z85830 Personal history of malignant neoplasm of bone: Secondary | ICD-10-CM | POA: Diagnosis not present

## 2018-01-25 DIAGNOSIS — Z79899 Other long term (current) drug therapy: Secondary | ICD-10-CM | POA: Insufficient documentation

## 2018-01-25 DIAGNOSIS — J449 Chronic obstructive pulmonary disease, unspecified: Secondary | ICD-10-CM | POA: Insufficient documentation

## 2018-01-25 DIAGNOSIS — I130 Hypertensive heart and chronic kidney disease with heart failure and stage 1 through stage 4 chronic kidney disease, or unspecified chronic kidney disease: Secondary | ICD-10-CM | POA: Insufficient documentation

## 2018-01-25 DIAGNOSIS — E1122 Type 2 diabetes mellitus with diabetic chronic kidney disease: Secondary | ICD-10-CM | POA: Diagnosis not present

## 2018-01-25 DIAGNOSIS — I5020 Unspecified systolic (congestive) heart failure: Secondary | ICD-10-CM | POA: Diagnosis not present

## 2018-01-25 DIAGNOSIS — R0602 Shortness of breath: Secondary | ICD-10-CM | POA: Diagnosis present

## 2018-01-25 DIAGNOSIS — N184 Chronic kidney disease, stage 4 (severe): Secondary | ICD-10-CM | POA: Insufficient documentation

## 2018-01-25 HISTORY — DX: Chronic obstructive pulmonary disease, unspecified: J44.9

## 2018-01-25 LAB — COMPREHENSIVE METABOLIC PANEL
ALBUMIN: 3.1 g/dL — AB (ref 3.5–5.0)
ALK PHOS: 119 U/L (ref 38–126)
ALT: 10 U/L — AB (ref 14–54)
ANION GAP: 13 (ref 5–15)
AST: 14 U/L — ABNORMAL LOW (ref 15–41)
BUN: 26 mg/dL — ABNORMAL HIGH (ref 6–20)
CALCIUM: 8.7 mg/dL — AB (ref 8.9–10.3)
CHLORIDE: 108 mmol/L (ref 101–111)
CO2: 22 mmol/L (ref 22–32)
CREATININE: 1.51 mg/dL — AB (ref 0.44–1.00)
GFR calc Af Amer: 36 mL/min — ABNORMAL LOW (ref >60)
GFR calc non Af Amer: 31 mL/min — ABNORMAL LOW (ref >60)
GLUCOSE: 106 mg/dL — AB (ref 65–99)
Potassium: 3.7 mmol/L (ref 3.5–5.1)
SODIUM: 143 mmol/L (ref 135–145)
Total Bilirubin: 0.4 mg/dL (ref 0.3–1.2)
Total Protein: 6.8 g/dL (ref 6.5–8.1)

## 2018-01-25 LAB — I-STAT TROPONIN, ED: Troponin i, poc: 0.03 ng/mL (ref 0.00–0.08)

## 2018-01-25 LAB — CBC WITH DIFFERENTIAL/PLATELET
BASOS ABS: 0 10*3/uL (ref 0.0–0.1)
BASOS PCT: 0 %
Eosinophils Absolute: 0.1 10*3/uL (ref 0.0–0.7)
Eosinophils Relative: 0 %
HCT: 29 % — ABNORMAL LOW (ref 36.0–46.0)
HEMOGLOBIN: 9.1 g/dL — AB (ref 12.0–15.0)
Lymphocytes Relative: 4 %
Lymphs Abs: 0.6 10*3/uL — ABNORMAL LOW (ref 0.7–4.0)
MCH: 24.7 pg — ABNORMAL LOW (ref 26.0–34.0)
MCHC: 31.4 g/dL (ref 30.0–36.0)
MCV: 78.8 fL (ref 78.0–100.0)
Monocytes Absolute: 0.5 10*3/uL (ref 0.1–1.0)
Monocytes Relative: 4 %
NEUTROS ABS: 12.4 10*3/uL — AB (ref 1.7–7.7)
Neutrophils Relative %: 92 %
PLATELETS: 419 10*3/uL — AB (ref 150–400)
RBC: 3.68 MIL/uL — ABNORMAL LOW (ref 3.87–5.11)
RDW: 21.5 % — ABNORMAL HIGH (ref 11.5–15.5)
WBC: 13.5 10*3/uL — AB (ref 4.0–10.5)

## 2018-01-25 LAB — BRAIN NATRIURETIC PEPTIDE: B Natriuretic Peptide: 534.2 pg/mL — ABNORMAL HIGH (ref 0.0–100.0)

## 2018-01-25 MED ORDER — METHYLPREDNISOLONE SODIUM SUCC 125 MG IJ SOLR
125.0000 mg | Freq: Once | INTRAMUSCULAR | Status: AC
  Administered 2018-01-25: 125 mg via INTRAVENOUS
  Filled 2018-01-25: qty 2

## 2018-01-25 MED ORDER — FUROSEMIDE 10 MG/ML IJ SOLN
20.0000 mg | Freq: Once | INTRAMUSCULAR | Status: AC
  Administered 2018-01-25: 20 mg via INTRAVENOUS
  Filled 2018-01-25: qty 4

## 2018-01-25 NOTE — Discharge Instructions (Addendum)
Increase your 40mg   Lasix to 1 pill twice a day for 3 days then go back to 1 pill a day.

## 2018-01-25 NOTE — ED Notes (Signed)
X-ray at bedside

## 2018-01-25 NOTE — ED Provider Notes (Signed)
[facility] DEPT Provider Note   CSN: [id number] Arrival date & time: 01/25/18  9622     History   Chief Complaint Chief Complaint  Patient presents with  . Shortness of Breath    HPI Eileen Cobb is a 83 y.o. female.  Patient was sent to the emergency department for mild shortness of breath.  Patient is a DNR and do not sent to the emergency department unless contacting her son.  Patient complains of mild shortness of breath  The history is provided by the patient. No language interpreter was used.  Shortness of Breath  This is a recurrent problem. The problem occurs continuously.The current episode started more than 2 days ago. The problem has not changed since onset.Pertinent negatives include no fever, no headaches, no cough, no chest pain, no abdominal pain and no rash. It is unknown what precipitated the problem. Risk factors: History of congestive heart. She has tried nothing for the symptoms. The treatment provided no relief. She has had prior hospitalizations. Associated medical issues do not include asthma.    Past Medical History:  Diagnosis Date  . Bone cancer ([place])   . COPD (chronic obstructive pulmonary disease) ([place])   . Depression   . Essential hypertension   . GERD (gastroesophageal reflux disease)   . Gout   . HLD (hyperlipidemia)   . Type II diabetes mellitus with renal manifestations [facility])     Patient Active Problem List   Diagnosis Date Noted  . Acute on chronic diastolic CHF (congestive heart failure) ([place]) 11/30/2017  . Chronic blood loss anemia 11/30/2017  . Hypervolemia 11/30/2017  . Anemia of chronic disease   . Stage 4 chronic kidney disease ([place])   . Normocytic anemia 07/02/2017  . GIB (gastrointestinal bleeding) 07/02/2017  . Essential hypertension   . HLD (hyperlipidemia)   . GERD (gastroesophageal reflux disease)   . Type II diabetes mellitus with renal manifestations ([place])   . Gout   . Depression      Past Surgical History:  Procedure Laterality Date  . ABOVE KNEE LEG AMPUTATION Right      OB History   None      Home Medications    Prior to Admission medications   Medication Sig Start Date End Date Taking? Authorizing Provider  acetaminophen (TYLENOL) 325 MG tablet Take 650 mg by mouth 2 (two) times daily as needed for mild pain.   Yes [provider]  albuterol (PROVENTIL) (2.5 MG/3ML) 0.083% nebulizer solution Take 2.5 mg by nebulization every 4 (four) hours as needed for wheezing or shortness of breath.   Yes [provider]  allopurinol (ZYLOPRIM) 100 MG tablet Take 100 mg by mouth 2 (two) times daily. 06/24/17  Yes [provider]  amLODipine (NORVASC) 5 MG tablet Take 5 mg by mouth daily. 06/09/17  Yes [provider]  atorvastatin (LIPITOR) 20 MG tablet Take 20 mg by mouth daily. 06/18/17  Yes [provider]  carboxymethylcellul-glycerin (OPTIVE) 0.5-0.9 % ophthalmic solution Place 1 drop into both eyes 4 (four) times daily as needed for dry eyes.   Yes [provider]  cycloSPORINE (RESTASIS) 0.05 % ophthalmic emulsion Place 1 drop into both eyes 2 (two) times daily.   Yes [provider]  DULoxetine (CYMBALTA) 30 MG capsule Take 30 mg by mouth daily. 06/09/17  Yes [provider]  Emollient (EUCERIN) lotion Apply 1 mL topically 2 (two) times daily. Apply to body and extremities twice daily   Yes [provider]  epoetin alfa (EPOGEN,PROCRIT) 32355 UNIT/ML injection Inject 20,000 Units into the skin once a week. On Fridays   Yes [provider]  ferrous sulfate 325 (65 FE) MG tablet Take 325 mg by mouth 2 (two) times daily with a meal.   Yes [provider]  folic acid (FOLVITE) 1 MG tablet Take 1 mg by mouth daily.   Yes [provider]  furosemide (LASIX) 40 MG tablet Take 1 tablet (40 mg total) by mouth daily. 12/07/17  Yes Emokpae, Courage, MD  hydrALAZINE  (APRESOLINE) 25 MG tablet Take 1 tablet (25 mg total) by mouth 4 (four) times daily. 12/07/17  Yes Roxan Hockey, MD  Multiple Vitamin (MULTIVITAMIN WITH MINERALS) TABS tablet Take 1 tablet by mouth daily. 12/08/17  Yes Emokpae, Courage, MD  omeprazole (PRILOSEC) 20 MG capsule Take 20 mg by mouth 2 (two) times daily.   Yes [provider]  ondansetron (ZOFRAN ODT) 4 MG disintegrating tablet Take 1 tablet (4 mg total) by mouth every 8 (eight) hours as needed for nausea or vomiting. 12/07/17  Yes Emokpae, Courage, MD  polyethylene glycol (MIRALAX) packet Take 17 g by mouth daily as needed for mild constipation. 07/05/17  Yes Barton Dubois, MD  Propylene Glycol (SYSTANE BALANCE OP) Place 1 drop into both eyes 2 (two) times daily. FOR DRY EYE SYNDROME   Yes [provider]  SYMBICORT 160-4.5 MCG/ACT inhaler Take 2 puffs by mouth 2 (two) times daily. 06/06/17  Yes [provider]  vitamin C (ASCORBIC ACID) 500 MG tablet Take 500 mg by mouth daily.   Yes [provider]    Family History Family History  Problem Relation Age of Onset  . Hypertension Other     Social History Social History   Tobacco Use  . Smoking status: Never Smoker  . Smokeless tobacco: Never Used  Substance Use Topics  . Alcohol use: No  . Drug use: No     Allergies   Patient has no known allergies.   Review of Systems Review of Systems  Constitutional: Negative for appetite change, fatigue and fever.  HENT: Negative for congestion, ear discharge and sinus pressure.   Eyes: Negative for discharge.  Respiratory: Positive for shortness of breath. Negative for cough.   Cardiovascular: Negative for chest pain.  Gastrointestinal: Negative for abdominal pain and diarrhea.  Genitourinary: Negative for frequency and hematuria.  Musculoskeletal: Negative for back pain.  Skin: Negative for rash.  Neurological: Negative for seizures and headaches.  Psychiatric/Behavioral: Negative for  hallucinations.     Physical Exam Updated Vital Signs BP (!) 186/95 (BP Location: Right Arm)   Pulse (!) 106   Temp 98.1 F (36.7 C) (Oral)   Resp (!) 26   SpO2 94%   Physical Exam  Constitutional: She is oriented to person, place, and time. She appears well-developed.  HENT:  Head: Normocephalic.  Eyes: Conjunctivae and EOM are normal. No scleral icterus.  Neck: Neck supple. No thyromegaly present.  Cardiovascular: Regular rhythm. Exam reveals no gallop and no friction rub.  No murmur heard. Pulmonary/Chest: No stridor. She has no wheezes. She has rales. She exhibits no tenderness.  Abdominal: She exhibits no distension. There is no tenderness. There is no rebound.  Musculoskeletal: Normal range of motion. She exhibits no edema.  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  Neurological: She is oriented to person, place, and time. She exhibits normal muscle tone. Coordination normal.  Skin: No  rash noted. No erythema.  Psychiatric: She has a normal mood and affect. Her behavior is normal.     ED Treatments / Results  Labs (all labs ordered are listed, but only abnormal results are displayed) Labs Reviewed  CBC WITH DIFFERENTIAL/PLATELET - Abnormal; Notable for the following components:      Result Value   WBC 13.5 (*)    RBC 3.68 (*)    Hemoglobin 9.1 (*)    HCT 29.0 (*)    MCH 24.7 (*)    RDW 21.5 (*)    Platelets 419 (*)    Neutro Abs 12.4 (*)    Lymphs Abs 0.6 (*)    All other components within normal limits  COMPREHENSIVE METABOLIC PANEL - Abnormal; Notable for the following components:   Glucose, Bld 106 (*)    BUN 26 (*)    Creatinine, Ser 1.51 (*)    Calcium 8.7 (*)    Albumin 3.1 (*)    AST 14 (*)    ALT 10 (*)    GFR calc non Af Amer 31 (*)    GFR calc Af Amer 36 (*)    All other components within normal limits  BRAIN NATRIURETIC PEPTIDE - Abnormal; Notable for the following components:   B Natriuretic  Peptide 534.2 (*)    All other components within normal limits  I-STAT TROPONIN, ED    EKG None  Radiology Dg Chest Port 1 View  Result Date: 01/25/2018 CLINICAL DATA:  Shortness of breath and fevers for 1 day EXAM: PORTABLE CHEST 1 VIEW COMPARISON:  12/06/2017 FINDINGS: Cardiac shadow remains enlarged. Persistent vascular congestion is noted with very mild interstitial edema. No sizable effusion is seen. No focal confluent infiltrate is noted. IMPRESSION: Mild vascular congestion and edema. Electronically Signed   By: Inez Catalina M.D.   On: 01/25/2018 09:35    Procedures Procedures (including critical care time)  Medications Ordered in ED Medications  furosemide (LASIX) injection 20 mg (has no administration in time range)  methylPREDNISolone sodium succinate (SOLU-MEDROL) 125 mg/2 mL injection 125 mg (125 mg Intravenous Given 01/25/18 0920)     Initial Impression / Assessment and Plan / ED Course  I have reviewed the triage vital signs and the nursing notes.  Pertinent labs & imaging results that were available during my care of the patient were reviewed by me and considered in my medical decision making (see chart for details).     Patient in mild congestive heart failure.  Patient has a history of heart failure.  She was given a small dose of Lasix and feels better she will be discharged home and the Lasix will be increased to 40 mg twice a day for 3 days and then back to 40 mg a day.  Final Clinical Impressions(s) / ED Diagnoses   Final diagnoses:  Systolic congestive heart failure, unspecified HF chronicity [facility])    ED Discharge Orders    None       Milton Ferguson, MD 01/25/18 1350

## 2018-01-25 NOTE — ED Triage Notes (Signed)
Per EMS, pt from [facility] complains of SOB since last night. Pt was given duoneb at facility this morning. Pt's O2 sats were low 80s on her normal 2L [place]. Pt placed on nonrebreather by fire department, sats improved. Pt placed on [place] 4L by EMS, sats stayed around 90%. Pt has hx of COPD.

## 2018-01-25 NOTE — ED Notes (Signed)
Bed: WA20 Expected date:  Expected time:  Means of arrival:  Comments: EMS-SOB 

## 2018-01-27 ENCOUNTER — Non-Acute Institutional Stay: Payer: Medicare Other | Admitting: Internal Medicine

## 2018-01-28 DIAGNOSIS — Z7189 Other specified counseling: Secondary | ICD-10-CM | POA: Insufficient documentation

## 2018-01-28 DIAGNOSIS — R0602 Shortness of breath: Secondary | ICD-10-CM | POA: Insufficient documentation

## 2018-01-28 DIAGNOSIS — R531 Weakness: Secondary | ICD-10-CM | POA: Insufficient documentation

## 2018-01-28 NOTE — Progress Notes (Signed)
PALLIATIVE CARE CONSULT VISIT   PATIENT NAME: Eileen Cobb DOB: [birth year] MRN: [id number]  PRIMARY CARE PROVIDER:   Duffy Cobb, Eileen Silvas, MD  REFERRING PROVIDER:      Duffy Cobb, Eileen Silvas, MD [place], [place] [address]  RESPONSIBLE PARTYJiles Prows Cobb(son) [phone]   RECOMMENDATIONS and PLAN:   1. Shortness of breath:  Chronic as related to CHF and chronic anemia.  Recurrent hospital visit for same.  Daily weights for improved CHF management.  Monitor atient 2.  Weakness:  At baseline per patient and staff in light of multiple chronic health issues.  Increase and participate in activities as tolerated.  Monitor CBC and elcctrolytes   3. . Palliative care patient:   DNR.  Pt. Desires to return to facility in [place] area which will be closer to her family.  Message left for her son to return call.  Plan on ACP discussions.  Palliative care will continue to follow.   I spent 15 minutes providing this consultation at [place],  fom 4:00pm  to 4:15pm.   More than 50% of the time in this consultation was spent croordinating communication with patient and facility staff.  HISTORY OF PRESENT ILLNESS:  Followup with  Eileen Cobb finds that she was evaluated at local hospital on 5/13 due to increased shortness of breath.  Symptoms improved with extra diuresing and pt returned to snf.    CODE STATUS: DNR  PPS: 40% HOSPICE ELIGIBILITY/DIAGNOSIS: TBD  PAST MEDICAL HISTORY:  Past Medical History:  Diagnosis Date  . Bone cancer ([place])   . COPD (chronic obstructive pulmonary disease) ([place])   . Depression   . Essential hypertension   . GERD (gastroesophageal reflux disease)   . Gout   . HLD (hyperlipidemia)   . Type II diabetes mellitus with renal manifestations ([facility])     SOCIAL HX:  Social History   Tobacco Use  . Smoking status: Never Smoker  . Smokeless tobacco: Never Used  Substance Use Topics  . Alcohol use: No    ALLERGIES: No Known  Allergies   PERTINENT MEDICATIONS:  Outpatient Encounter Medications as of 01/27/2018  Medication Sig  . acetaminophen (TYLENOL) 325 MG tablet Take 650 mg by mouth 2 (two) times daily as needed for mild pain.  Marland Kitchen albuterol (PROVENTIL) (2.5 MG/3ML) 0.083% nebulizer solution Take 2.5 mg by nebulization every 4 (four) hours as needed for wheezing or shortness of breath.  . allopurinol (ZYLOPRIM) 100 MG tablet Take 100 mg by mouth 2 (two) times daily.  Marland Kitchen amLODipine (NORVASC) 5 MG tablet Take 5 mg by mouth daily.  Marland Kitchen atorvastatin (LIPITOR) 20 MG tablet Take 20 mg by mouth daily.  . carboxymethylcellul-glycerin (OPTIVE) 0.5-0.9 % ophthalmic solution Place 1 drop into both eyes 4 (four) times daily as needed for dry eyes.  . cycloSPORINE (RESTASIS) 0.05 % ophthalmic emulsion Place 1 drop into both eyes 2 (two) times daily.  . DULoxetine (CYMBALTA) 30 MG capsule Take 30 mg by mouth daily.  . Emollient (EUCERIN) lotion Apply 1 mL topically 2 (two) times daily. Apply to body and extremities twice daily  . epoetin alfa (EPOGEN,PROCRIT) 83662 UNIT/ML injection Inject 20,000 Units into the skin once a week. On Fridays  . ferrous sulfate 325 (65 FE) MG tablet Take 325 mg by mouth 2 (two) times daily with a meal.  . folic acid (FOLVITE) 1 MG tablet Take 1 mg by mouth daily.  . furosemide (LASIX) 40 MG tablet Take 1 tablet (40  mg total) by mouth daily.  . hydrALAZINE (APRESOLINE) 25 MG tablet Take 1 tablet (25 mg total) by mouth 4 (four) times daily.  . Multiple Vitamin (MULTIVITAMIN WITH MINERALS) TABS tablet Take 1 tablet by mouth daily.  Marland Kitchen omeprazole (PRILOSEC) 20 MG capsule Take 20 mg by mouth 2 (two) times daily.  . ondansetron (ZOFRAN ODT) 4 MG disintegrating tablet Take 1 tablet (4 mg total) by mouth every 8 (eight) hours as needed for nausea or vomiting.  . polyethylene glycol (MIRALAX) packet Take 17 g by mouth daily as needed for mild constipation.  Marland Kitchen Propylene Glycol (SYSTANE BALANCE OP) Place 1 drop  into both eyes 2 (two) times daily. FOR DRY EYE SYNDROME  . SYMBICORT 160-4.5 MCG/ACT inhaler Take 2 puffs by mouth 2 (two) times daily.  . vitamin C (ASCORBIC ACID) 500 MG tablet Take 500 mg by mouth daily.   No facility-administered encounter medications on file as of 01/27/2018.     PHYSICAL EXAM:   General: Obese elderly female in [facility] and in NAD Cardiovascular: regular rate and rhythm Pulmonary: Expiratory wheeze bilat upper lobes. O2 via [place] in use Abdomen: soft, nontender, + bowel sounds GU: no suprapubic tenderness Extremities: R AKA, no edema,  Skin: no rashes Neurological: Alert and oriented x 3, Generalized weakness.   Eileen Lex, NP-C

## 2018-02-17 ENCOUNTER — Non-Acute Institutional Stay: Payer: Medicare Other | Admitting: Internal Medicine

## 2018-02-17 DIAGNOSIS — R630 Anorexia: Secondary | ICD-10-CM

## 2018-02-17 DIAGNOSIS — R0602 Shortness of breath: Secondary | ICD-10-CM

## 2018-02-17 DIAGNOSIS — Z7189 Other specified counseling: Secondary | ICD-10-CM

## 2018-02-17 DIAGNOSIS — R531 Weakness: Secondary | ICD-10-CM

## 2018-02-17 NOTE — Progress Notes (Signed)
PALLIATIVE CARE CONSULT VISIT   PATIENT NAME: Eileen Cobb DOB: 1935/02/11 MRN: [id number]  PRIMARY CARE PROVIDER:   Earlyne Iba, MD  REFERRING PROVIDER:      [name], Manya Silvas, MD [place], [place] [address]  RESPONSIBLE PARTY:  Self and son Terena Bohan        RECOMMENDATIONS and PLAN:  1. Weakness R53.1:  Increased beyond chronic state.  Multifactorial  2. Shortness of breath R06.02:  Additional decline beyond chronic respiratory failure.  ShOB at rest Sats 93% with O2 in use  3. Loss of appetite R63.0:  Pt has been declining meals x 2days."She doesn't like the food". Eating bites of ice cream and sips of liquids. Continue to encourage and assist with meals.  4. Advanced Care planning Z71.89:  DNAR/DNI.  Pt. States that she does not want to return to hospital. Multiple previous messages and attempts to contact son [name] without return call. DNR/DNI and MOST form indicating comfort measures have been completed and on chart.  Discussed pt. Status with Dr. Burt Knack and was given recommendations for Hospice Care which was declined and in return, receipt of suggestions for comfort measures in the future as needed at facility was given by Dr. Burt Knack.  Palliative care will follow patient.  I spent 45 minutes providing this consultation at [facility],  from 12:30pm to 1:15pm. More than 50% of the time in this consultation was spent coordinating communication with patient, clinical staff, Dr. Burt Knack, [place] and SW.   HISTORY OF PRESENT ILLNESS: Follow-up with  [name].  Staff report that patient has not been eating meals for aprox. 2 days, has increased weakness and shortness of breath.  Pt. Declines to go to hospital for evaluationl.  CODE STATUS: DNR/DNI  PPS: 20% HOSPICE ELIGIBILITY/DIAGNOSIS: YES. CHF/COPD  PAST MEDICAL HISTORY:  Past Medical History:  Diagnosis Date  . Bone cancer ([place])   . COPD (chronic obstructive pulmonary disease) ([place])    . Depression   . Essential hypertension   . GERD (gastroesophageal reflux disease)   . Gout   . HLD (hyperlipidemia)   . Type II diabetes mellitus with renal manifestations ([facility])     SOCIAL HX:  Social History   Tobacco Use  . Smoking status: Never Smoker  . Smokeless tobacco: Never Used  Substance Use Topics  . Alcohol use: No    ALLERGIES: No Known Allergies   PERTINENT MEDICATIONS:  Outpatient Encounter Medications as of 02/17/2018  Medication Sig  . acetaminophen (TYLENOL) 325 MG tablet Take 650 mg by mouth 2 (two) times daily as needed for mild pain.  Marland Kitchen albuterol (PROVENTIL) (2.5 MG/3ML) 0.083% nebulizer solution Take 2.5 mg by nebulization every 4 (four) hours as needed for wheezing or shortness of breath.  . allopurinol (ZYLOPRIM) 100 MG tablet Take 100 mg by mouth 2 (two) times daily.  Marland Kitchen amLODipine (NORVASC) 5 MG tablet Take 5 mg by mouth daily.  Marland Kitchen atorvastatin (LIPITOR) 20 MG tablet Take 20 mg by mouth daily.  . carboxymethylcellul-glycerin (OPTIVE) 0.5-0.9 % ophthalmic solution Place 1 drop into both eyes 4 (four) times daily as needed for dry eyes.  . cycloSPORINE (RESTASIS) 0.05 % ophthalmic emulsion Place 1 drop into both eyes 2 (two) times daily.  . DULoxetine (CYMBALTA) 30 MG capsule Take 30 mg by mouth daily.  . Emollient (EUCERIN) lotion Apply 1 mL topically 2 (two) times daily. Apply to body and extremities twice daily  . epoetin alfa (EPOGEN,PROCRIT) 84696 UNIT/ML injection Inject  20,000 Units into the skin once a week. On Fridays  . ferrous sulfate 325 (65 FE) MG tablet Take 325 mg by mouth 2 (two) times daily with a meal.  . folic acid (FOLVITE) 1 MG tablet Take 1 mg by mouth daily.  . furosemide (LASIX) 40 MG tablet Take 1 tablet (40 mg total) by mouth daily.  . hydrALAZINE (APRESOLINE) 25 MG tablet Take 1 tablet (25 mg total) by mouth 4 (four) times daily.  . Multiple Vitamin (MULTIVITAMIN WITH MINERALS) TABS tablet Take 1 tablet by mouth daily.  Marland Kitchen  omeprazole (PRILOSEC) 20 MG capsule Take 20 mg by mouth 2 (two) times daily.  . ondansetron (ZOFRAN ODT) 4 MG disintegrating tablet Take 1 tablet (4 mg total) by mouth every 8 (eight) hours as needed for nausea or vomiting.  . polyethylene glycol (MIRALAX) packet Take 17 g by mouth daily as needed for mild constipation.  Marland Kitchen Propylene Glycol (SYSTANE BALANCE OP) Place 1 drop into both eyes 2 (two) times daily. FOR DRY EYE SYNDROME  . SYMBICORT 160-4.5 MCG/ACT inhaler Take 2 puffs by mouth 2 (two) times daily.  . vitamin C (ASCORBIC ACID) 500 MG tablet Take 500 mg by mouth daily.   No facility-administered encounter medications on file as of 02/17/2018.     PHYSICAL EXAM:   General: Chronically ill and fatigued appearing elderly female resting in bed. Being fed by OT.  Sips and bites of foods only. Remainder of meal declined by pt. Cardiovascular: regular rate and rhythm Pulmonary: clear ant fields. O2 via  in use. Mild ShOB. O2 sats=93% Abdomen: soft, nontender, + bowel sounds GU: no suprapubic tenderness Extremities: R AKA.   Skin: Exposed skin is intact Neurological: Weakness, alert. Less talkative.  Gonzella Lex, NP-C

## 2018-02-24 ENCOUNTER — Non-Acute Institutional Stay: Payer: Medicare Other | Admitting: Internal Medicine

## 2018-03-02 ENCOUNTER — Non-Acute Institutional Stay: Payer: Medicare Other | Admitting: Internal Medicine

## 2018-03-04 DIAGNOSIS — R630 Anorexia: Secondary | ICD-10-CM | POA: Insufficient documentation

## 2018-03-04 DIAGNOSIS — R601 Generalized edema: Secondary | ICD-10-CM | POA: Insufficient documentation

## 2018-03-04 NOTE — Progress Notes (Signed)
PALLIATIVE CARE CONSULT VISIT   PATIENT NAME: Eileen Cobb DOB: 01/31/1935 MRN: [id number]  PRIMARY CARE PROVIDER:   Earlyne Iba, MD  REFERRING PROVIDER:      [name], Manya Silvas, MD [place], [place] [address]  RESPONSIBLE PARTY:  Self and son Porshea Janowski        RECOMMENDATIONS and PLAN:  1. Weakness R53.1:  Increased beyond chronic state.  Multifactorial and appears to be nearing end of life.  Plan on providing comfort care measures at facility.  Hospice referral per order DrMarland Kitchen Christianne Borrow.  [facility] contacted for same.   2. Shortness of breath R06.02:  Additional decline beyond chronic respiratory failure.  ShOB at rest. Sats 77-83% with O2 in use 3.5to 4L/min.   Not life sustainable  3.  Generalized edema R60.1: Accelerated.  No urinary output after receipt of Furosemide 80mg  today. (40mg  extra).  Foley cath with only 5 cc urine.  3. Loss of appetite R63.0:  Recurrent after return of normal appetite and nutritional intake. . Not life sustaining.  4. Advanced Care planning Z71.89:  DNAR/DNI.  Pt. States that she does not want to return to hospital. MOST form previously signed by son denoting choice of comfort measures. Staff reports conversation with son on 03/01/18 and updates given. Several attempts to reach son via phone which produced a voicemail message.  No return call from patient. Staff and family friend that was contacted(Ramona R). With patient's permission)state that son is "on a cruise". She was also unable to contact son.  Nena Polio, NP and Dr. Christianne Borrow were notified of pt's decline, expressions for comfort measures and current MOST form selections. Rx. For comfort meds written for Roxanol 5mg  q 6hrs, Ativan 0.5mg  tablet, 1 PO q 6hrs, Haldol prn agitation and Atropine 1% gtts 4 gtts q 4hrs prn terminal secretions given to nurse Stacy.   I spent 60 minutes providing this consultation at [facility],  from 12:15pm to 1:15pm. More than 50% of the  time in this consultation was spent coordinating communication with patient, clinical staff, NP LaToya/Dr. Christianne Borrow and SW.   HISTORY OF PRESENT ILLNESS: Follow-up with  [name].  Staff report that patient has not been eating meals for aprox. 2 days, has increased weakness and shortness of breath with hypoxia. Pt. Declines to go to hospital for evaluation.  She has been prescribed with additional diurectics.  They have been unable to reach son who is away on a cruise trip.  CODE STATUS: DNR/DNI  PPS: 0% HOSPICE ELIGIBILITY/DIAGNOSIS: YES. CHF/COPD/HYPOXIA  PAST MEDICAL HISTORY:  Past Medical History:  Diagnosis Date  . Bone cancer ([place])   . COPD (chronic obstructive pulmonary disease) ([place])   . Depression   . Essential hypertension   . GERD (gastroesophageal reflux disease)   . Gout   . HLD (hyperlipidemia)   . Type II diabetes mellitus with renal manifestations ([facility])     SOCIAL HX:  Social History   Tobacco Use  . Smoking status: Never Smoker  . Smokeless tobacco: Never Used  Substance Use Topics  . Alcohol use: No    ALLERGIES: No Known Allergies   PERTINENT MEDICATIONS:  Outpatient Encounter Medications as of 02/17/2018  Medication Sig  . acetaminophen (TYLENOL) 325 MG tablet Take 650 mg by mouth 2 (two) times daily as needed for mild pain.  Marland Kitchen albuterol (PROVENTIL) (2.5 MG/3ML) 0.083% nebulizer solution Take 2.5 mg by nebulization every 4 (four) hours as needed for wheezing or shortness of breath.  Marland Kitchen  allopurinol (ZYLOPRIM) 100 MG tablet Take 100 mg by mouth 2 (two) times daily.  Marland Kitchen amLODipine (NORVASC) 5 MG tablet Take 5 mg by mouth daily.  Marland Kitchen atorvastatin (LIPITOR) 20 MG tablet Take 20 mg by mouth daily.  . carboxymethylcellul-glycerin (OPTIVE) 0.5-0.9 % ophthalmic solution Place 1 drop into both eyes 4 (four) times daily as needed for dry eyes.  . cycloSPORINE (RESTASIS) 0.05 % ophthalmic emulsion Place 1 drop into both eyes 2 (two) times daily.  . DULoxetine  (CYMBALTA) 30 MG capsule Take 30 mg by mouth daily.  . Emollient (EUCERIN) lotion Apply 1 mL topically 2 (two) times daily. Apply to body and extremities twice daily  . epoetin alfa (EPOGEN,PROCRIT) 81157 UNIT/ML injection Inject 20,000 Units into the skin once a week. On Fridays  . ferrous sulfate 325 (65 FE) MG tablet Take 325 mg by mouth 2 (two) times daily with a meal.  . folic acid (FOLVITE) 1 MG tablet Take 1 mg by mouth daily.  . furosemide (LASIX) 40 MG tablet Take 1 tablet (40 mg total) by mouth daily.  . hydrALAZINE (APRESOLINE) 25 MG tablet Take 1 tablet (25 mg total) by mouth 4 (four) times daily.  . Multiple Vitamin (MULTIVITAMIN WITH MINERALS) TABS tablet Take 1 tablet by mouth daily.  Marland Kitchen omeprazole (PRILOSEC) 20 MG capsule Take 20 mg by mouth 2 (two) times daily.  . ondansetron (ZOFRAN ODT) 4 MG disintegrating tablet Take 1 tablet (4 mg total) by mouth every 8 (eight) hours as needed for nausea or vomiting.  . polyethylene glycol (MIRALAX) packet Take 17 g by mouth daily as needed for mild constipation.  Marland Kitchen Propylene Glycol (SYSTANE BALANCE OP) Place 1 drop into both eyes 2 (two) times daily. FOR DRY EYE SYNDROME  . SYMBICORT 160-4.5 MCG/ACT inhaler Take 2 puffs by mouth 2 (two) times daily.  . vitamin C (ASCORBIC ACID) 500 MG tablet Take 500 mg by mouth daily.   No facility-administered encounter medications on file as of 02/17/2018.     PHYSICAL EXAM:   General: Chronically ill appearing elderly female resting in bed.  Moaning Cardiovascular: regular rate and rhythm Pulmonary: Audible wet breath sounds.  Rhonchi in all lung fields.  O2 via [place] in use Sats 77% on 3.5L and increased to 83% on 4L  Increased work of breathing. Mouth breathing.  Abdomen: soft, nontender, + bowel sounds GU: no suprapubic tenderness or fullness.  Foley cathetar inserted visit with receipt of aprox. 5cc dark amber urine Extremities: R AKA. Generalized edema of upper and lower extremities   Skin:  Exposed skin is intact and warm Neurological: Somnolent.  Opens eyes spontaneously.  Nods yes and no to questions appropriately. Weakness.    Gonzella Lex, NP-C

## 2018-03-15 DEATH — deceased

## 2019-12-06 IMAGING — DX DG CHEST 1V PORT
1 series · 1 of 1 positions shown · non-contrast
Comparison: 12/06/2017

CLINICAL DATA: Shortness of breath and fevers for 1 day

EXAM:
PORTABLE CHEST 1 VIEW

[chest ap]
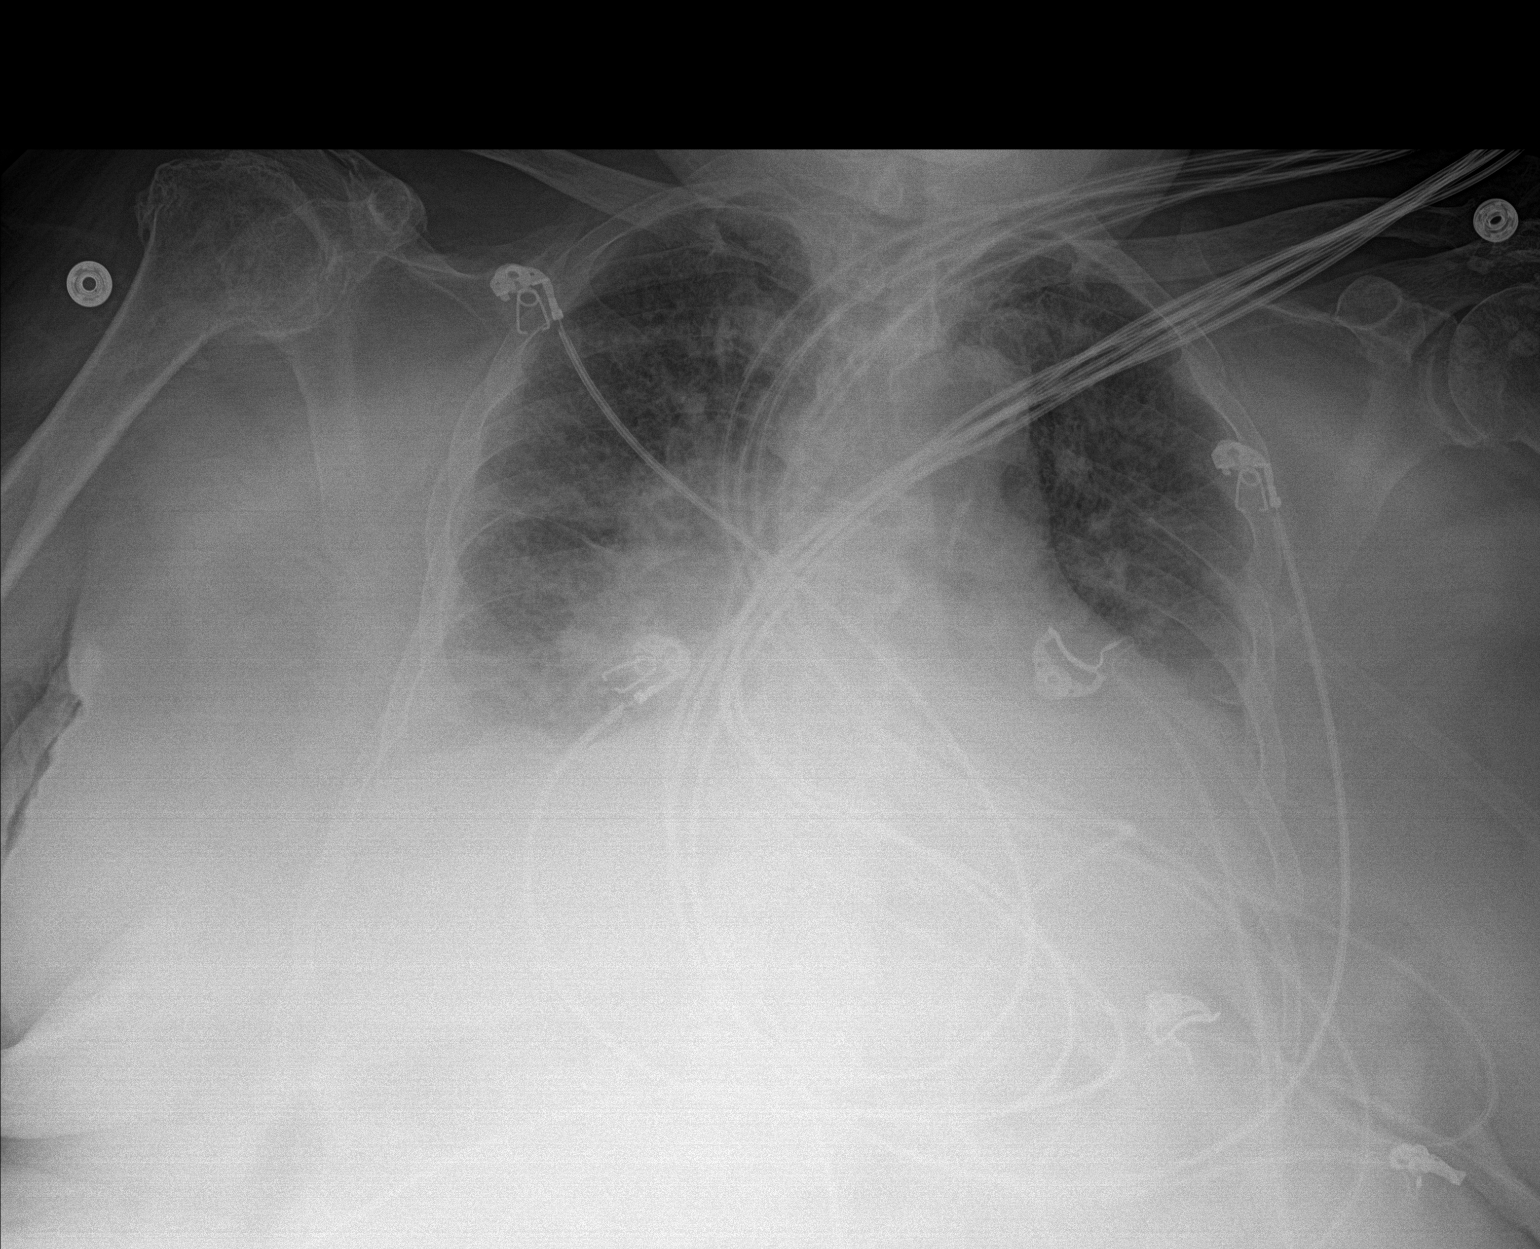

[1 of 1 positions shown; findings below may reference images not displayed]

FINDINGS: Cardiac shadow remains enlarged. Persistent vascular congestion is
noted with very mild interstitial edema. No sizable effusion is
seen. No focal confluent infiltrate is noted.
IMPRESSION: Mild vascular congestion and edema.
